# Patient Record
Sex: Male | Born: 1970 | Race: White | Hispanic: No | Marital: Married | Smoking: Current every day smoker
Health system: Southern US, Community
[De-identification: ages and names within clinical notes are randomized; demographics above are authoritative.]

## PROBLEM LIST (undated history)

## (undated) DIAGNOSIS — I10 Essential (primary) hypertension: Secondary | ICD-10-CM

## (undated) DIAGNOSIS — E291 Testicular hypofunction: Secondary | ICD-10-CM

## (undated) DIAGNOSIS — F909 Attention-deficit hyperactivity disorder, unspecified type: Secondary | ICD-10-CM

## (undated) DIAGNOSIS — G5602 Carpal tunnel syndrome, left upper limb: Secondary | ICD-10-CM

## (undated) DIAGNOSIS — N529 Male erectile dysfunction, unspecified: Secondary | ICD-10-CM

## (undated) DIAGNOSIS — F988 Other specified behavioral and emotional disorders with onset usually occurring in childhood and adolescence: Secondary | ICD-10-CM

## (undated) DIAGNOSIS — F419 Anxiety disorder, unspecified: Secondary | ICD-10-CM

## (undated) DIAGNOSIS — G43909 Migraine, unspecified, not intractable, without status migrainosus: Secondary | ICD-10-CM

## (undated) DIAGNOSIS — E559 Vitamin D deficiency, unspecified: Secondary | ICD-10-CM

## (undated) DIAGNOSIS — G4733 Obstructive sleep apnea (adult) (pediatric): Secondary | ICD-10-CM

## (undated) DIAGNOSIS — G4730 Sleep apnea, unspecified: Secondary | ICD-10-CM

## (undated) DIAGNOSIS — J449 Chronic obstructive pulmonary disease, unspecified: Secondary | ICD-10-CM

## (undated) DIAGNOSIS — E785 Hyperlipidemia, unspecified: Secondary | ICD-10-CM

## (undated) DIAGNOSIS — Z720 Tobacco use: Secondary | ICD-10-CM

## (undated) DIAGNOSIS — Z79899 Other long term (current) drug therapy: Secondary | ICD-10-CM

## (undated) DIAGNOSIS — E669 Obesity, unspecified: Secondary | ICD-10-CM

## (undated) DIAGNOSIS — E063 Autoimmune thyroiditis: Secondary | ICD-10-CM

## (undated) HISTORY — DX: Other specified behavioral and emotional disorders with onset usually occurring in childhood and adolescence: F98.8

## (undated) HISTORY — DX: Testicular hypofunction: E29.1

## (undated) HISTORY — DX: Tobacco use: Z72.0

## (undated) HISTORY — DX: Anxiety disorder, unspecified: F41.9

## (undated) HISTORY — DX: Attention-deficit hyperactivity disorder, unspecified type: F90.9

## (undated) HISTORY — DX: Other long term (current) drug therapy: Z79.899

## (undated) HISTORY — PX: HEMORRHOID SURGERY: SHX153

## (undated) HISTORY — DX: Carpal tunnel syndrome, left upper limb: G56.02

## (undated) HISTORY — DX: Obesity, unspecified: E66.9

## (undated) HISTORY — DX: Male erectile dysfunction, unspecified: N52.9

## (undated) HISTORY — DX: Vitamin D deficiency, unspecified: E55.9

## (undated) HISTORY — DX: Hyperlipidemia, unspecified: E78.5

## (undated) HISTORY — DX: Migraine, unspecified, not intractable, without status migrainosus: G43.909

## (undated) HISTORY — DX: Sleep apnea, unspecified: G47.30

## (undated) HISTORY — DX: Obstructive sleep apnea (adult) (pediatric): G47.33

## (undated) HISTORY — DX: Essential (primary) hypertension: I10

---

## 2004-12-18 ENCOUNTER — Emergency Department: Payer: Self-pay | Admitting: Emergency Medicine

## 2005-05-06 ENCOUNTER — Emergency Department: Payer: Self-pay | Admitting: Unknown Physician Specialty

## 2005-05-15 ENCOUNTER — Emergency Department: Payer: Self-pay | Admitting: General Practice

## 2011-05-14 ENCOUNTER — Emergency Department (HOSPITAL_COMMUNITY)
Admission: EM | Admit: 2011-05-14 | Discharge: 2011-05-14 | Disposition: A | Discharge status: Home Health | Payer: BC Managed Care – PPO | Attending: Emergency Medicine | Admitting: Emergency Medicine

## 2011-05-14 ENCOUNTER — Encounter (HOSPITAL_COMMUNITY): Payer: Self-pay | Admitting: Emergency Medicine

## 2011-05-14 ENCOUNTER — Emergency Department (HOSPITAL_COMMUNITY): Payer: BC Managed Care – PPO

## 2011-05-14 ENCOUNTER — Other Ambulatory Visit: Payer: Self-pay

## 2011-05-14 DIAGNOSIS — F17200 Nicotine dependence, unspecified, uncomplicated: Secondary | ICD-10-CM | POA: Insufficient documentation

## 2011-05-14 DIAGNOSIS — J4489 Other specified chronic obstructive pulmonary disease: Secondary | ICD-10-CM | POA: Insufficient documentation

## 2011-05-14 DIAGNOSIS — J449 Chronic obstructive pulmonary disease, unspecified: Secondary | ICD-10-CM | POA: Insufficient documentation

## 2011-05-14 DIAGNOSIS — R079 Chest pain, unspecified: Secondary | ICD-10-CM | POA: Insufficient documentation

## 2011-05-14 DIAGNOSIS — G43909 Migraine, unspecified, not intractable, without status migrainosus: Secondary | ICD-10-CM | POA: Insufficient documentation

## 2011-05-14 DIAGNOSIS — I10 Essential (primary) hypertension: Secondary | ICD-10-CM | POA: Insufficient documentation

## 2011-05-14 HISTORY — DX: Chronic obstructive pulmonary disease, unspecified: J44.9

## 2011-05-14 HISTORY — DX: Essential (primary) hypertension: I10

## 2011-05-14 LAB — DIFFERENTIAL
Basophils Absolute: 0.1 10*3/uL (ref 0.0–0.1)
Eosinophils Relative: 3 % (ref 0–5)
Lymphocytes Relative: 38 % (ref 12–46)
Neutro Abs: 5.6 10*3/uL (ref 1.7–7.7)

## 2011-05-14 LAB — BASIC METABOLIC PANEL
Calcium: 9.6 mg/dL (ref 8.4–10.5)
Creatinine, Ser: 0.79 mg/dL (ref 0.50–1.35)
GFR calc Af Amer: >90 mL/min (ref >90)
GFR calc non Af Amer: >90 mL/min (ref >90)

## 2011-05-14 LAB — CBC
Platelets: 250 10*3/uL (ref 150–400)
RDW: 13.3 % (ref 11.5–15.5)
WBC: 11.1 10*3/uL — ABNORMAL HIGH (ref 4.0–10.5)

## 2011-05-14 LAB — POCT I-STAT TROPONIN I
Troponin i, poc: 0 ng/mL (ref 0.00–0.08)
Troponin i, poc: 0 ng/mL (ref 0.00–0.08)

## 2011-05-14 MED ORDER — DIPHENHYDRAMINE HCL 50 MG/ML IJ SOLN
INTRAMUSCULAR | Status: AC
  Filled 2011-05-14: qty 1

## 2011-05-14 MED ORDER — POTASSIUM CHLORIDE CRYS ER 20 MEQ PO TBCR
40.0000 meq | EXTENDED_RELEASE_TABLET | Freq: Once | ORAL | Status: AC
  Administered 2011-05-14: 40 meq via ORAL
  Filled 2011-05-14: qty 2

## 2011-05-14 MED ORDER — DIPHENHYDRAMINE HCL 50 MG/ML IJ SOLN
25.0000 mg | Freq: Once | INTRAMUSCULAR | Status: AC
Start: 2011-05-14 — End: 2011-05-14
  Administered 2011-05-14: 25 mg via INTRAVENOUS

## 2011-05-14 MED ORDER — DIPHENHYDRAMINE HCL 25 MG PO CAPS: 25.0000 mg | ORAL_CAPSULE | Freq: Once | ORAL | Status: DC

## 2011-05-14 MED ORDER — ASPIRIN 81 MG PO CHEW
324.0000 mg | CHEWABLE_TABLET | Freq: Once | ORAL | Status: AC
  Administered 2011-05-14: 324 mg via ORAL
  Filled 2011-05-14: qty 4

## 2011-05-14 MED ORDER — METOCLOPRAMIDE HCL 5 MG/ML IJ SOLN
10.0000 mg | Freq: Once | INTRAMUSCULAR | Status: AC
  Administered 2011-05-14: 10 mg via INTRAMUSCULAR
  Filled 2011-05-14: qty 2

## 2011-05-14 NOTE — ED Provider Notes (Signed)
History     CSN: [id number]  Arrival date & time 05/14/11  0011   First MD Initiated Contact with Patient 05/14/11 0302      Chief Complaint  Patient presents with  . Chest Pain    (Consider location/radiation/quality/duration/timing/severity/associated sxs/prior treatment) Patient is a 41 y.o. male presenting with chest pain. The history is provided by the patient. No language interpreter was used.  Chest Pain The chest pain began 6 - 12 hours ago. Chest pain occurs constantly. The chest pain is unchanged. Associated with: nothing. At its most intense, the pain is at 9/10. The pain is currently at 1/10. The quality of the pain is described as dull. The pain does not radiate. Exacerbated by: nothing. Pertinent negatives for primary symptoms include no fatigue, no syncope, no shortness of breath, no cough, no wheezing, no palpitations, no abdominal pain and no dizziness.  Pertinent negatives for associated symptoms include no claudication, no lower extremity edema, no numbness and no weakness. He tried nothing for the symptoms. Risk factors include male gender and smoking/tobacco exposure.  Pertinent negatives for past medical history include no Marfan's syndrome and no seizures.  Pertinent negatives for family medical history include: no Marfan's syndrome in family.  Procedure history is negative for cardiac catheterization, echocardiogram and exercise treadmill test.   Also has worsening of his migraines with BP being elevated.  Pain is present for days but was not sudden onset and not the worst Gaylyn Rong of his life.  No neuro deficits.  No tractional components.  Pain is throbbing and 7/10 and improved markedly as pressure went down.    Past Medical History  Diagnosis Date  . Hypertension   . COPD (chronic obstructive pulmonary disease)     History reviewed. No pertinent past surgical history.  No family history on file.  History  Substance Use Topics  . Smoking status: Current  Everyday Smoker  . Smokeless tobacco: Not on file  . Alcohol Use: No      Review of Systems  Constitutional: Negative.  Negative for fatigue.  Eyes: Negative.   Respiratory: Negative for cough, shortness of breath and wheezing.   Cardiovascular: Positive for chest pain. Negative for palpitations, claudication and syncope.  Gastrointestinal: Negative for abdominal pain and abdominal distention.  Genitourinary: Negative.   Skin: Negative.   Neurological: Positive for headaches. Negative for dizziness, seizures, facial asymmetry, speech difficulty, weakness and numbness.  Hematological: Negative.   Psychiatric/Behavioral: Negative.     Allergies  Review of patient's allergies indicates no known allergies.  Home Medications   Current Outpatient Rx  Name Route Sig Dispense Refill  . BUTALBITAL-ACETAMINOPHEN 50-650 MG PO TABS Oral Take 1 tablet by mouth every 8 (eight) hours as needed. For headaches    . ALPRAZOLAM 1 MG PO TABS Oral Take 1 mg by mouth 3 (three) times daily as needed. anxiety    . LOSARTAN POTASSIUM-HCTZ 50-12.5 MG PO TABS Oral Take 1 tablet by mouth daily.      BP 123/76  Pulse 72  Temp(Src) 97.7 F (36.5 C) (Oral)  Resp 20  SpO2 97%  Physical Exam  Constitutional: He is oriented to person, place, and time. He appears well-developed and well-nourished. No distress.  HENT:  Head: Normocephalic and atraumatic.  Mouth/Throat: Oropharynx is clear and moist.  Eyes: Conjunctivae are normal. Pupils are equal, round, and reactive to light.  Neck: Normal range of motion. Neck supple. No JVD present.  Cardiovascular: Normal rate and regular rhythm.   Pulmonary/Chest: Effort  normal and breath sounds normal. He has no wheezes. He has no rales.  Abdominal: Soft. Bowel sounds are normal.  Musculoskeletal: Normal range of motion. He exhibits no edema.  Neurological: He is alert and oriented to person, place, and time.  Skin: Skin is warm and dry.  Psychiatric: He has a  normal mood and affect.    ED Course  Procedures (including critical care time)  Labs Reviewed  BASIC METABOLIC PANEL - Abnormal; Notable for the following:    Potassium 3.0 (*)    Glucose, Bld 105 (*)    All other components within normal limits  CBC - Abnormal; Notable for the following:    WBC 11.1 (*)    MCHC 36.3 (*)    All other components within normal limits  DIFFERENTIAL - Abnormal; Notable for the following:    Lymphs Abs 4.2 (*)    All other components within normal limits  POCT I-STAT TROPONIN I  POCT I-STAT TROPONIN I   Dg Chest 2 View  05/14/2011  *RADIOLOGY REPORT*  Clinical Data: Chest pain, hypertension, shortness of breath.  CHEST - 2 VIEW  Comparison: None.  Findings: Mild bronchitic change. Subpleural bullous change.  No focal consolidation.  No pleural effusion or pneumothorax. Cardiomediastinal contours within normal limits.  No acute osseous abnormality.  IMPRESSION: Mild bronchitic change without focal consolidation.  Original Report Authenticated By: Waneta Martins, M.D.     No diagnosis found.   Date: 05/14/2011  Rate: 72  Rhythm: normal sinus rhythm  QRS Axis: normal  Intervals: normal  ST/T Wave abnormalities: normal  Conduction Disutrbances:none  Narrative Interpretation:   Old EKG Reviewed: none available   MDM  EDP verbalized to the patient intent to place patient in observation with stress test in am but patient adamantly refuses.  Approached patient multiple times to elicit his cooperation but each time refused to stay.  EDP explained to patient with the pts wife present that the risk of signing out AMA was but not limited to: death, heart attack, congestive heart failure, coma, prolonged morbidity.  Patient is AO3 and has decision making capacity to refuse.  He is welcomed to return at any time and should follow up immediately with his family doctor.  Patient and wife verbalize understanding        Oprah Camarena K Alecxander Mainwaring-Rasch,  MD 05/14/11 [phone]

## 2011-05-14 NOTE — Discharge Instructions (Signed)
Arterial Hypertension Arterial hypertension (high blood pressure) is a condition of elevated pressure in your blood vessels. Hypertension over a long period of time is a risk factor for strokes, heart attacks, and heart failure. It is also the leading cause of kidney (renal) failure.  CAUSES   In Adults -- Over 90% of all hypertension has no known cause. This is called essential or primary hypertension. In the other 10% of people with hypertension, the increase in blood pressure is caused by another disorder. This is called secondary hypertension. Important causes of secondary hypertension are:   Heavy alcohol use.   Obstructive sleep apnea.   Hyperaldosterosim (Conn's syndrome).   Steroid use.   Chronic kidney failure.   Hyperparathyroidism.   Medications.   Renal artery stenosis.   Pheochromocytoma.   Cushing's disease.   Coarctation of the aorta.   Scleroderma renal crisis.   Licorice (in excessive amounts).   Drugs (cocaine, methamphetamine).  Your caregiver can explain any items above that apply to you.  In Children -- Secondary hypertension is more common and should always be considered.   Pregnancy -- Few women of childbearing age have high blood pressure. However, up to 10% of them develop hypertension of pregnancy. Generally, this will not harm the woman. It Even be a sign of 3 complications of pregnancy: preeclampsia, HELLP syndrome, and eclampsia. Follow up and control with medication is necessary.  SYMPTOMS   This condition normally does not produce any noticeable symptoms. It is usually found during a routine exam.   Malignant hypertension is a late problem of high blood pressure. It Monger have the following symptoms:   Headaches.   Blurred vision.   End-organ damage (this means your kidneys, heart, lungs, and other organs are being damaged).   Stressful situations can increase the blood pressure. If a person with normal blood pressure has their blood  pressure go up while being seen by their caregiver, this is often termed "white coat hypertension." Its importance is not known. It Alkire be related with eventually developing hypertension or complications of hypertension.   Hypertension is often confused with mental tension, stress, and anxiety.  DIAGNOSIS  The diagnosis is made by 3 separate blood pressure measurements. They are taken at least 1 week apart from each other. If there is organ damage from hypertension, the diagnosis Lemire be made without repeat measurements. Hypertension is usually identified by having blood pressure readings:  Above 140/90 mmHg measured in both arms, at 3 separate times, over a couple weeks.   Over 130/80 mmHg should be considered a risk factor and Grisanti require treatment in patients with diabetes.  Blood pressure readings over 120/80 mmHg are called "pre-hypertension" even in non-diabetic patients. To get a true blood pressure measurement, use the following guidelines. Be aware of the factors that can alter blood pressure readings.  Take measurements at least 1 hour after caffeine.   Take measurements 30 minutes after smoking and without any stress. This is another reason to quit smoking - it raises your blood pressure.   Use a proper cuff size. Ask your caregiver if you are not sure about your cuff size.   Most home blood pressure cuffs are automatic. They will measure systolic and diastolic pressures. The systolic pressure is the pressure reading at the start of sounds. Diastolic pressure is the pressure at which the sounds disappear. If you are elderly, measure pressures in multiple postures. Try sitting, lying or standing.   Sit at rest for a minimum of   5 minutes before taking measurements.   You should not be on any medications like decongestants. These are found in many cold medications.   Record your blood pressure readings and review them with your caregiver.  If you have hypertension:  Your caregiver  may do tests to be sure you do not have secondary hypertension (see "causes" above).   Your caregiver may also look for signs of metabolic syndrome. This is also called Syndrome X or Insulin Resistance Syndrome. You may have this syndrome if you have type 2 diabetes, abdominal obesity, and abnormal blood lipids in addition to hypertension.   Your caregiver will take your medical and family history and perform a physical exam.   Diagnostic tests may include blood tests (for glucose, cholesterol, potassium, and kidney function), a urinalysis, or an EKG. Other tests may also be necessary depending on your condition.  PREVENTION  There are important lifestyle issues that you can adopt to reduce your chance of developing hypertension:  Maintain a normal weight.   Limit the amount of salt (sodium) in your diet.   Exercise often.   Limit alcohol intake.   Get enough potassium in your diet. Discuss specific advice with your caregiver.   Follow a DASH diet (dietary approaches to stop hypertension). This diet is rich in fruits, vegetables, and low-fat dairy products, and avoids certain fats.  PROGNOSIS  Essential hypertension cannot be cured. Lifestyle changes and medical treatment can lower blood pressure and reduce complications. The prognosis of secondary hypertension depends on the underlying cause. Many people whose hypertension is controlled with medicine or lifestyle changes can live a normal, healthy life.  RISKS AND COMPLICATIONS  While high blood pressure alone is not an illness, it often requires treatment due to its short- and long-term effects on many organs. Hypertension increases your risk for:  CVAs or strokes (cerebrovascular accident).   Heart failure due to chronically high blood pressure (hypertensive cardiomyopathy).   Heart attack (myocardial infarction).   Damage to the retina (hypertensive retinopathy).   Kidney failure (hypertensive nephropathy).  Your caregiver can  explain list items above that apply to you. Treatment of hypertension can significantly reduce the risk of complications. TREATMENT   For overweight patients, weight loss and regular exercise are recommended. Physical fitness lowers blood pressure.   Mild hypertension is usually treated with diet and exercise. A diet rich in fruits and vegetables, fat-free dairy products, and foods low in fat and salt (sodium) can help lower blood pressure. Decreasing salt intake decreases blood pressure in a 1/3 of people.   Stop smoking if you are a smoker.  The steps above are highly effective in reducing blood pressure. While these actions are easy to suggest, they are difficult to achieve. Most patients with moderate or severe hypertension end up requiring medications to bring their blood pressure down to a normal level. There are several classes of medications for treatment. Blood pressure pills (antihypertensives) will lower blood pressure by their different actions. Lowering the blood pressure by 10 mmHg may decrease the risk of complications by as much as 25%. The goal of treatment is effective blood pressure control. This will reduce your risk for complications. Your caregiver will help you determine the best treatment for you according to your lifestyle. What is excellent treatment for one person, may not be for you. HOME CARE INSTRUCTIONS   Do not smoke.   Follow the lifestyle changes outlined in the "Prevention" section.   If you are on medications, follow the directions   carefully. Blood pressure medications must be taken as prescribed. Skipping doses reduces their benefit. It also puts you at risk for problems.   Follow up with your caregiver, as directed.   If you are asked to monitor your blood pressure at home, follow the guidelines in the "Diagnosis" section above.  SEEK MEDICAL CARE IF:   You think you are having medication side effects.   You have recurrent headaches or lightheadedness.     You have swelling in your ankles.   You have trouble with your vision.  SEEK IMMEDIATE MEDICAL CARE IF:   You have sudden onset of chest pain or pressure, difficulty breathing, or other symptoms of a heart attack.   You have a severe headache.   You have symptoms of a stroke (such as sudden weakness, difficulty speaking, difficulty walking).  MAKE SURE YOU:   Understand these instructions.   Will watch your condition.   Will get help right away if you are not doing well or get worse.  Document Released: 02/16/2005 Document Revised: 02/05/2011 Document Reviewed: 09/16/2006 [facility] Patient Information 2012 [place], [place].Arterial Hypertension Arterial hypertension (high blood pressure) is a condition of elevated pressure in your blood vessels. Hypertension over a long period of time is a risk factor for strokes, heart attacks, and heart failure. It is also the leading cause of kidney (renal) failure.  CAUSES   In Adults -- Over 90% of all hypertension has no known cause. This is called essential or primary hypertension. In the other 10% of people with hypertension, the increase in blood pressure is caused by another disorder. This is called secondary hypertension. Important causes of secondary hypertension are:   Heavy alcohol use.   Obstructive sleep apnea.   Hyperaldosterosim (Conn's syndrome).   Steroid use.   Chronic kidney failure.   Hyperparathyroidism.   Medications.   Renal artery stenosis.   Pheochromocytoma.   Cushing's disease.   Coarctation of the aorta.   Scleroderma renal crisis.   Licorice (in excessive amounts).   Drugs (cocaine, methamphetamine).  Your caregiver can explain any items above that apply to you.  In Children -- Secondary hypertension is more common and should always be considered.   Pregnancy -- Few women of childbearing age have high blood pressure. However, up to 10% of them develop hypertension of pregnancy. Generally, this  will not harm the woman. It may be a sign of 3 complications of pregnancy: preeclampsia, HELLP syndrome, and eclampsia. Follow up and control with medication is necessary.  SYMPTOMS   This condition normally does not produce any noticeable symptoms. It is usually found during a routine exam.   Malignant hypertension is a late problem of high blood pressure. It may have the following symptoms:   Headaches.   Blurred vision.   End-organ damage (this means your kidneys, heart, lungs, and other organs are being damaged).   Stressful situations can increase the blood pressure. If a person with normal blood pressure has their blood pressure go up while being seen by their caregiver, this is often termed "white coat hypertension." Its importance is not known. It may be related with eventually developing hypertension or complications of hypertension.   Hypertension is often confused with mental tension, stress, and anxiety.  DIAGNOSIS  The diagnosis is made by 3 separate blood pressure measurements. They are taken at least 1 week apart from each other. If there is organ damage from hypertension, the diagnosis may be made without repeat measurements. Hypertension is usually identified by having  blood pressure readings:  Above 140/90 mmHg measured in both arms, at 3 separate times, over a couple weeks.   Over 130/80 mmHg should be considered a risk factor and may require treatment in patients with diabetes.  Blood pressure readings over 120/80 mmHg are called "pre-hypertension" even in non-diabetic patients. To get a true blood pressure measurement, use the following guidelines. Be aware of the factors that can alter blood pressure readings.  Take measurements at least 1 hour after caffeine.   Take measurements 30 minutes after smoking and without any stress. This is another reason to quit smoking - it raises your blood pressure.   Use a proper cuff size. Ask your caregiver if you are not sure  about your cuff size.   Most home blood pressure cuffs are automatic. They will measure systolic and diastolic pressures. The systolic pressure is the pressure reading at the start of sounds. Diastolic pressure is the pressure at which the sounds disappear. If you are elderly, measure pressures in multiple postures. Try sitting, lying or standing.   Sit at rest for a minimum of 5 minutes before taking measurements.   You should not be on any medications like decongestants. These are found in many cold medications.   Record your blood pressure readings and review them with your caregiver.  If you have hypertension:  Your caregiver may do tests to be sure you do not have secondary hypertension (see "causes" above).   Your caregiver may also look for signs of metabolic syndrome. This is also called Syndrome X or Insulin Resistance Syndrome. You may have this syndrome if you have type 2 diabetes, abdominal obesity, and abnormal blood lipids in addition to hypertension.   Your caregiver will take your medical and family history and perform a physical exam.   Diagnostic tests may include blood tests (for glucose, cholesterol, potassium, and kidney function), a urinalysis, or an EKG. Other tests may also be necessary depending on your condition.  PREVENTION  There are important lifestyle issues that you can adopt to reduce your chance of developing hypertension:  Maintain a normal weight.   Limit the amount of salt (sodium) in your diet.   Exercise often.   Limit alcohol intake.   Get enough potassium in your diet. Discuss specific advice with your caregiver.   Follow a DASH diet (dietary approaches to stop hypertension). This diet is rich in fruits, vegetables, and low-fat dairy products, and avoids certain fats.  PROGNOSIS  Essential hypertension cannot be cured. Lifestyle changes and medical treatment can lower blood pressure and reduce complications. The prognosis of secondary  hypertension depends on the underlying cause. Many people whose hypertension is controlled with medicine or lifestyle changes can live a normal, healthy life.  RISKS AND COMPLICATIONS  While high blood pressure alone is not an illness, it often requires treatment due to its short- and long-term effects on many organs. Hypertension increases your risk for:  CVAs or strokes (cerebrovascular accident).   Heart failure due to chronically high blood pressure (hypertensive cardiomyopathy).   Heart attack (myocardial infarction).   Damage to the retina (hypertensive retinopathy).   Kidney failure (hypertensive nephropathy).  Your caregiver can explain list items above that apply to you. Treatment of hypertension can significantly reduce the risk of complications. TREATMENT   For overweight patients, weight loss and regular exercise are recommended. Physical fitness lowers blood pressure.   Mild hypertension is usually treated with diet and exercise. A diet rich in fruits and vegetables, fat-free dairy  products, and foods low in fat and salt (sodium) can help lower blood pressure. Decreasing salt intake decreases blood pressure in a 1/3 of people.   Stop smoking if you are a smoker.  The steps above are highly effective in reducing blood pressure. While these actions are easy to suggest, they are difficult to achieve. Most patients with moderate or severe hypertension end up requiring medications to bring their blood pressure down to a normal level. There are several classes of medications for treatment. Blood pressure pills (antihypertensives) will lower blood pressure by their different actions. Lowering the blood pressure by 10 mmHg may decrease the risk of complications by as much as 25%. The goal of treatment is effective blood pressure control. This will reduce your risk for complications. Your caregiver will help you determine the best treatment for you according to your lifestyle. What is  excellent treatment for one person, may not be for you. HOME CARE INSTRUCTIONS   Do not smoke.   Follow the lifestyle changes outlined in the "Prevention" section.   If you are on medications, follow the directions carefully. Blood pressure medications must be taken as prescribed. Skipping doses reduces their benefit. It also puts you at risk for problems.   Follow up with your caregiver, as directed.   If you are asked to monitor your blood pressure at home, follow the guidelines in the "Diagnosis" section above.  SEEK MEDICAL CARE IF:   You think you are having medication side effects.   You have recurrent headaches or lightheadedness.   You have swelling in your ankles.   You have trouble with your vision.  SEEK IMMEDIATE MEDICAL CARE IF:   You have sudden onset of chest pain or pressure, difficulty breathing, or other symptoms of a heart attack.   You have a severe headache.   You have symptoms of a stroke (such as sudden weakness, difficulty speaking, difficulty walking).  MAKE SURE YOU:   Understand these instructions.   Will watch your condition.   Will get help right away if you are not doing well or get worse.  Document Released: 02/16/2005 Document Revised: 02/05/2011 Document Reviewed: 09/16/2006 [facility] Patient Information 2012 [place], [place].

## 2011-05-14 NOTE — ED Notes (Signed)
First contact with pt. Pt placed on monitor. Iv start wife at bedside

## 2011-05-14 NOTE — ED Notes (Signed)
PT. REPORTS SUBSTERNAL CHEST PAIN WITH SOB , COUGH , NAUSEA AND DIZZINESS , ALSO REPORTS ELEVATED BLOOD PRESSURE FOR 3 DAYS , SEEN AT A WALK-IN CLINIC TODAY ADVISED TO INCREASE HIS BLOOD PRESSURE MEDICATION ( LOSARTAN ).

## 2014-10-01 NOTE — Progress Notes (Signed)
This encounter was created in error - please disregard.

## 2014-10-04 ENCOUNTER — Encounter: Payer: Self-pay | Admitting: Internal Medicine

## 2014-10-10 ENCOUNTER — Encounter: Payer: Self-pay | Admitting: Internal Medicine

## 2014-12-04 ENCOUNTER — Telehealth: Payer: Self-pay | Admitting: Physician Assistant

## 2014-12-04 NOTE — Telephone Encounter (Signed)
Patty from [facility] is calling about patient that is at their office with abnormal EKG, dizziness, and HTN. Since patient is unable to be seen until Friday, their office wants to know if they need to start patient on a beta blocker prior to his appointment. Requested them to send a fax of EKG.

## 2014-12-04 NOTE — Telephone Encounter (Signed)
Received ekg via fax.  Dr. Elease Hashimoto reviewed ekg and spoke with Cletis Athens, NP.  He advised ekg showed a great deal of artifact but appeared to show NSR.  He advised she increase HCTZ to 25 mg and patient come in for new patient appointment which is scheduled for Friday 10/7.  EKG placed in file for scanning into epic.

## 2014-12-04 NOTE — Telephone Encounter (Signed)
New message     Pt has a new pt appt scheduled on Friday.  [facility] want to know if we want them to start pt on a beta blocker prior to his appt on Friday?

## 2014-12-05 ENCOUNTER — Encounter: Payer: Self-pay | Admitting: Physician Assistant

## 2014-12-05 ENCOUNTER — Other Ambulatory Visit: Payer: Self-pay

## 2014-12-06 NOTE — Progress Notes (Signed)
Cardiology Office Note    Date:  12/07/2014   ID:  Benjamin Small, DOB 07/10/1970, MRN [id number]  PCP:  Dema Severin, NP  Cardiologist:  New patient     History of Present Illness: Benjamin Small is a 44 y.o. male with a hx of ADHD on adderall, uncontrolled HTN, anxiety/PTSD, obesity, tobacco abuse, OSA non complaint with CPAP, migraines and no prior cardiac history who presents to clinic for evaluation of CP and HTN.   No DM or HLD per patient. Apparently had a stress test ~ 2 years ago that was normal.  His paternal uncle had bypass in 30s. He smokes 1PPD for ~ 20 years.   Sometimes patient gets a pressure in his chest that gets worse with anxiety. He currently has 1/10 chest pressure while sitting in the office. He works in [employer] and is pretty active on the job. He does not formally exercise but walks a lot at work. He denies exertional CP but does get "winded" sometimes. CP does not radiate or associated with SOB, diaphoresis, nausea. Sometimes has palpitations. He has a watch that tracks his HR and it seems to always be elevated. Sometimes it is low 100s when sleeping. He doesn't know what it does with exertion. He sometimes has some lightheadedness with over- exertion.  No orthopnea, PND or LE edema.  He was seen at primary care yesterday and we were called for HTN and abnormal ECG. This was faxed to Dr. Elease Hashimoto who said it looked like sinus tach but there was a lot of artifact. He advised she increase HCTZ to 25 mg and patient come in for new patient appointment which is scheduled for Friday 10/7.  ECG today NSR with RAE HR 97. He currently takes Hyzarr-HCTZ 50-12.5 mg (increased to 100-25mg  yesterday) and amlodipine  and BP is 144/88. Repeat 154/98.    Studies:  none  Recent Labs/Images:  No results for input(s): NA, K, BUN, CREATININE, ALT, HGB, TSH, LDLCALC, LDLDIRECT, HDL, BNP, PROBNP in the last 8760 hours.  Invalid input(s): LDL   Dg  Chest 2 View  05/14/2011   *RADIOLOGY REPORT*  Clinical Data: Chest pain, hypertension, shortness of breath.  CHEST - 2 VIEW  Comparison: None.  Findings: Mild bronchitic change. Subpleural bullous change.  No focal consolidation.  No pleural effusion or pneumothorax. Cardiomediastinal contours within normal limits.  No acute osseous abnormality.  IMPRESSION: Mild bronchitic change without focal consolidation.  Original Report Authenticated By: Waneta Martins, M.D.    Wt Readings from Last 3 Encounters:  12/07/14 298 lb 12.8 oz (135.535 kg)     Past Medical History  Diagnosis Date  . Hypertension   . COPD (chronic obstructive pulmonary disease) (HCC)   . Obesity   . Adult ADHD   . ADD (attention deficit disorder)   . Migraines   . Sleep apnea   . Hyperlipidemia   . Testicular hypofunction   . Male erectile disorder   . Long term use of drug   . Carpal tunnel syndrome on left   . Anxiety disorder   . Vitamin D deficiency     Current Outpatient Prescriptions  Medication Sig Dispense Refill  . ACETAMINOPHEN-BUTALBITAL 50-650 MG TABS Take 1 tablet by mouth every 8 (eight) hours as needed. For headaches    . ADDERALL XR 20 MG 24 hr capsule TAKE 2 CAPSULE BY MOUTH  IN THE MORNING FOR ADHD  0  . ALPRAZolam (XANAX) 1 MG tablet Take 1 mg by mouth  3 (three) times daily as needed. anxiety    . amLODipine (NORVASC) 10 MG tablet Take 10 mg by mouth daily.    . Cholecalciferol 5000 UNITS TABS Take 5,000 Units by mouth once a week.    . fluticasone (FLONASE) 50 MCG/ACT nasal spray Place 1 spray into both nostrils daily as needed for allergies.     Marland Kitchen loratadine (CLARITIN) 10 MG tablet Take 10 mg by mouth daily as needed for allergies.     Marland Kitchen losartan-hydrochlorothiazide (HYZAAR) 100-25 MG tablet Take 1 tablet by mouth daily. 30 tablet 6  . Tadalafil (CIALIS) 2.5 MG TABS Take 2.5 mg by mouth daily as needed (Erectile Dysfunction).     . carvedilol (COREG) 3.125 MG tablet Take 1 tablet (3.125  mg total) by mouth 2 (two) times daily with a meal. 60 tablet 6  . varenicline (CHANTIX STARTING MONTH PAK) 0.5 MG X 11 & 1 MG X 42 tablet Take one 0.5 mg tablet by mouth once daily for 3 days, then increase to one 0.5 mg tablet twice daily for 4 days, then increase to one 1 mg tablet twice daily. 53 tablet 0  . varenicline (CHANTIX) 0.5 MG tablet Take 1 tablet (0.5 mg total) by mouth 2 (two) times daily. 60 tablet 0   No current facility-administered medications for this visit.     Allergies:   Review of patient's allergies indicates no known allergies.   Social History:  The patient  reports that he has been smoking.  He does not have any smokeless tobacco history on file. He reports that he does not drink alcohol or use illicit drugs.   Family History:  The patient's family history includes Cancer in his father; Other in his mother.   ROS:  Please see the history of present illness. All other systems reviewed and negative.    PHYSICAL EXAM: VS:  BP 154/98 mmHg  Pulse 97  Ht  (1.753 m)  Wt 298 lb 12.8 oz (135.535 kg)  BMI 44.10 kg/m2 Well nourished, well developed, in no acute distress HEENT: normal Neck: no JVD Cardiac:  normal S1, S2; RRR; no murmur Lungs:  clear to auscultation bilaterally, no wheezing, rhonchi or rales Abd: soft, nontender, no hepatomegaly Ext: no edema Skin: warm and dry Neuro:  CNs 2-12 intact, no focal abnormalities noted  EKG:  ECG RAE HR 97     ASSESSMENT AND PLAN:  Benjamin Small is a 43 y.o. male with a hx of ADHD on adderall, uncontrolled HTN, anxiety/PTSD, obesity, tobacco abuse, OSA non complaint with CPAP, migraines and no prior cardiac history who presents to clinic for evaluation of CP and HTN.    Chest pain- atypical but he has many RFs including family hx of early CAD, uncontrolled HTN, obesity and long term smoking. We will obtain ETT nuclear stress test  HTN- likely exacerbated by uncontrolled OSA and adderall use. Will  continue amlodipine  and losartan -HCTZ  ( started by PCP) will add Coreg 3.125mg  BID today as well. Patient will continued to monitor his BPs at home.  Resting tachycardia/subjective palpitations- Will add coreg 3.125mg  BID for resting tachycardia, palpitations and further BP control.   Tobacco abuse- interested in chantix. Will write Rx  Severe OSA- non complaint with CPAP. Will have him set up with our sleep study clinic to get mask refit.   Disposition:  FU with a cardiologist in 2-3 weeks to establish care. I have recommended Dr. Horris Latino.    (seen by DOD DR  Camnitz today as well- he agrees with plan)  Signed, Eustace Pen, PA-C, MHS 12/07/2014 10:48 AM    [facility] [address] [place], [place], [place]  [address] Phone: [phone]; Fax: [phone]

## 2014-12-07 ENCOUNTER — Other Ambulatory Visit: Payer: Self-pay | Admitting: Physician Assistant

## 2014-12-07 ENCOUNTER — Encounter: Payer: Self-pay | Admitting: Physician Assistant

## 2014-12-07 ENCOUNTER — Ambulatory Visit (INDEPENDENT_AMBULATORY_CARE_PROVIDER_SITE_OTHER): Payer: BLUE CROSS/BLUE SHIELD | Admitting: Physician Assistant

## 2014-12-07 VITALS — BP 154/98 | HR 97 | Ht 69.0 in | Wt 298.8 lb

## 2014-12-07 DIAGNOSIS — I10 Essential (primary) hypertension: Secondary | ICD-10-CM | POA: Diagnosis not present

## 2014-12-07 DIAGNOSIS — R079 Chest pain, unspecified: Secondary | ICD-10-CM

## 2014-12-07 DIAGNOSIS — G4733 Obstructive sleep apnea (adult) (pediatric): Secondary | ICD-10-CM

## 2014-12-07 MED ORDER — VARENICLINE TARTRATE 0.5 MG PO TABS: 0.5000 mg | ORAL_TABLET | Freq: Two times a day (BID) | ORAL | Status: DC

## 2014-12-07 MED ORDER — CARVEDILOL 3.125 MG PO TABS: 3.1250 mg | ORAL_TABLET | Freq: Two times a day (BID) | ORAL | Status: DC

## 2014-12-07 MED ORDER — LOSARTAN POTASSIUM-HCTZ 100-25 MG PO TABS: 1.0000 | ORAL_TABLET | Freq: Every day | ORAL | Status: DC

## 2014-12-07 MED ORDER — VARENICLINE TARTRATE 0.5 MG X 11 & 1 MG X 42 PO MISC: ORAL | Status: DC

## 2014-12-07 NOTE — Patient Instructions (Addendum)
Medication Instructions:   START TAKING  CARVEDILOL  3.125 MG TWICE A DAY  START STARTER MONTH PACK -CHANTIX   AFTER COMPLETION OF MONTH PACK START TAKING CHANTIX 0.5 MG TWICE  A DAY   Labwork:  NONE ORDER TODAY   Testing/Procedures:  Your physician has requested that you have en exercise stress myoview. For further information please visit [email or web address] Please follow instruction sheet, as given.   Your physician has recommended that you have a sleep study. This test records several body functions during sleep, including: brain activity, eye movement, oxygen and carbon dioxide blood levels, heart rate and rhythm, breathing rate and rhythm, the flow of air through your mouth and nose, snoring, body muscle movements, and chest and belly movement.  Follow-Up:  IN 2 TO 3 WEEKS WITH TIFFANY [place] AT [facility] AS NEW PATIENT OR THE NEXT AVAILABLE APPOINTMENT WITH A PROVIDER TO BE ESTABLISHED AS NEW PT   Any Other Special Instructions Will Be Listed Below (If Applicable).  RECOMMEND TO CONTINUE CHANTIX PRESCRIPTION CONSULT PRIMARY CARE DOCTER REGINA YORK

## 2014-12-10 ENCOUNTER — Telehealth: Payer: Self-pay | Admitting: Physician Assistant

## 2014-12-10 NOTE — Telephone Encounter (Signed)
New message      Calling to see if it is ok to presc cialis ?  Their office has closed for the day.  Please call tomorrow with the answer.

## 2014-12-10 NOTE — Telephone Encounter (Signed)
Will forward to Cline Crock, PA-C for review and advisement.

## 2014-12-12 ENCOUNTER — Telehealth (HOSPITAL_COMMUNITY): Payer: Self-pay | Admitting: *Deleted

## 2014-12-12 NOTE — Telephone Encounter (Signed)
Patient given detailed instructions per Myocardial Perfusion Study Information Sheet for test on 12/17/14 at 1230. Patient notified to arrive 15 minutes early and that it is imperative to arrive on time for appointment to keep from having the test rescheduled.  If you need to cancel or reschedule your appointment, please call the office within 24 hours of your appointment. Failure to do so may result in a cancellation of your appointment, and a $50 no show fee. Patient verbalized understanding. Antionette CharMary J Amaree Leeper, RN

## 2014-12-17 ENCOUNTER — Ambulatory Visit (HOSPITAL_COMMUNITY): Payer: BLUE CROSS/BLUE SHIELD | Attending: Cardiovascular Disease

## 2014-12-17 DIAGNOSIS — I10 Essential (primary) hypertension: Secondary | ICD-10-CM | POA: Diagnosis not present

## 2014-12-17 DIAGNOSIS — I517 Cardiomegaly: Secondary | ICD-10-CM | POA: Insufficient documentation

## 2014-12-17 DIAGNOSIS — R0789 Other chest pain: Secondary | ICD-10-CM | POA: Diagnosis not present

## 2014-12-17 DIAGNOSIS — R079 Chest pain, unspecified: Secondary | ICD-10-CM | POA: Diagnosis present

## 2014-12-17 DIAGNOSIS — R0609 Other forms of dyspnea: Secondary | ICD-10-CM | POA: Diagnosis not present

## 2014-12-17 DIAGNOSIS — R002 Palpitations: Secondary | ICD-10-CM | POA: Insufficient documentation

## 2014-12-17 DIAGNOSIS — F17200 Nicotine dependence, unspecified, uncomplicated: Secondary | ICD-10-CM | POA: Diagnosis not present

## 2014-12-17 DIAGNOSIS — R42 Dizziness and giddiness: Secondary | ICD-10-CM | POA: Diagnosis not present

## 2014-12-17 MED ORDER — TECHNETIUM TC 99M SESTAMIBI GENERIC - CARDIOLITE
32.5000 | Freq: Once | INTRAVENOUS | Status: AC | PRN
  Administered 2014-12-17: 33 via INTRAVENOUS

## 2014-12-18 ENCOUNTER — Encounter (HOSPITAL_COMMUNITY): Payer: BLUE CROSS/BLUE SHIELD | Attending: Internal Medicine

## 2014-12-18 DIAGNOSIS — R0789 Other chest pain: Secondary | ICD-10-CM | POA: Diagnosis not present

## 2014-12-18 DIAGNOSIS — R0989 Other specified symptoms and signs involving the circulatory and respiratory systems: Secondary | ICD-10-CM

## 2014-12-18 LAB — MYOCARDIAL PERFUSION IMAGING
CHL CUP NUCLEAR SDS: 0
CHL CUP NUCLEAR SSS: 3
LHR: 0.33
LV dias vol: 170 mL
LV sys vol: 78 mL
Peak HR: 97 {beats}/min
Rest HR: 80 {beats}/min
SRS: 3
TID: 0.95

## 2014-12-18 MED ORDER — REGADENOSON 0.4 MG/5ML IV SOLN
0.4000 mg | Freq: Once | INTRAVENOUS | Status: AC
  Administered 2014-12-18: 0.4 mg via INTRAVENOUS

## 2014-12-18 MED ORDER — TECHNETIUM TC 99M SESTAMIBI GENERIC - CARDIOLITE
32.9000 | Freq: Once | INTRAVENOUS | Status: AC | PRN
  Administered 2014-12-18: 32.9 via INTRAVENOUS

## 2014-12-24 NOTE — Progress Notes (Signed)
Cardiology Office Note   Date:  12/25/2014   ID:  Benjamin Small, DOB 03-24-1970, MRN [id number]  PCP:  Dema SeverinYORK,REGINA F, NP  Cardiologist:   Madilyn Hookandolph, Amillia Biffle P, MD   Chief Complaint  Patient presents with  . New Evaluation    follow up from stress test  . Hypertension      History of Present Illness: Benjamin Small is a 44 y.o. male with ADHD, hypertension, anxiety, PTST, OSA, obesity and tobacco abuse who presents for follow up on chest pain and hypertension.  Benjamin Small saw Cline CrockKathryn Thompson on 10/6 with a report of chest pain.  EKG at that appointment was unremarkable.  BP was poorly-controlled (154/98) so carvedilol was added to his regimen.  He was referred for treadmill Cardiolite which was negative for ischemia.  Benjamin Small notes that his chest pain has resolved.  He thinks it was mostly due to anxiety and stress at his job.  He reported a hostile work environment and was subsequently fired.  Since then his stress level has been much better and he has not had any further episodes.  He has been taking Chantix for 2 week and has noticed a decrease in his desire to smoke.  He is now smoking 3/4 pack down from 1.5-2 packs/day.  He has not been wearing his CPAP mask because the mask does not fit well.  He is awaiting a refitting of his mask. He reports occasional LE edema but denies orthopnea or PND.   Past Medical History  Diagnosis Date  . Hypertension   . COPD (chronic obstructive pulmonary disease) (HCC)   . Obesity   . Adult ADHD   . ADD (attention deficit disorder)   . Migraines   . Sleep apnea   . Hyperlipidemia   . Testicular hypofunction   . Male erectile disorder   . Long term use of drug   . Carpal tunnel syndrome on left   . Anxiety disorder   . Vitamin D deficiency   . Essential hypertension 12/25/2014  . OSA (obstructive sleep apnea) 12/25/2014  . Tobacco abuse 12/25/2014    Past Surgical History  Procedure Laterality Date  . Hemorrhoid surgery        Current Outpatient Prescriptions  Medication Sig Dispense Refill  . ACETAMINOPHEN-BUTALBITAL 50-650 MG TABS Take 1 tablet by mouth every 8 (eight) hours as needed. For headaches    . ADDERALL XR 20 MG 24 hr capsule TAKE 2 CAPSULE BY MOUTH  IN THE MORNING FOR ADHD  0  . ALPRAZolam (XANAX) 1 MG tablet Take 1 mg by mouth 3 (three) times daily as needed. anxiety    . amLODipine (NORVASC) 10 MG tablet Take 10 mg by mouth daily.    . carvedilol (COREG) 25 MG tablet Take 1 tablet (25 mg total) by mouth 2 (two) times daily with a meal. 60 tablet 11  . Cholecalciferol 5000 UNITS TABS Take 5,000 Units by mouth once a week.    . fluticasone (FLONASE) 50 MCG/ACT nasal spray Place 1 spray into both nostrils daily as needed for allergies.     Marland Kitchen. loratadine (CLARITIN) 10 MG tablet Take 10 mg by mouth daily as needed for allergies.     Marland Kitchen. losartan-hydrochlorothiazide (HYZAAR) 100-25 MG tablet Take 1 tablet by mouth daily. 30 tablet 6  . Tadalafil (CIALIS) 2.5 MG TABS Take 1 tablet (2.5 mg total) by mouth daily as needed (Erectile Dysfunction). 10 tablet 0  . varenicline (CHANTIX) 0.5 MG tablet Take 1  tablet (0.5 mg total) by mouth 2 (two) times daily. 60 tablet 0   No current facility-administered medications for this visit.    Allergies:   Review of patient's allergies indicates no known allergies.    Social History:  The patient  reports that he has been smoking.  He does not have any smokeless tobacco history on file. He reports that he does not drink alcohol or use illicit drugs.   Family History:  The patient's family history includes Alcohol abuse in his mother; Breast cancer in his paternal grandmother; Cancer in his father, maternal grandfather, and maternal grandmother.    ROS:  Please see the history of present illness.  Otherwise, review of systems are positive for none.   All other systems are reviewed and negative.    PHYSICAL EXAM: VS:  BP 174/110 mmHg  Pulse 79  Ht  (1.753 m)   Wt 137.032 kg (302 lb 1.6 oz)  BMI 44.59 kg/m2 , BMI Body mass index is 44.59 kg/(m^2). GENERAL:  Well appearing HEENT:  Pupils equal round and reactive, fundi not visualized, oral mucosa unremarkable NECK:  No jugular venous distention, waveform within normal limits, carotid upstroke brisk and symmetric, no bruits, no thyromegaly LYMPHATICS:  No cervical adenopathy LUNGS:  Clear to auscultation bilaterally HEART:  RRR.  PMI not displaced or sustained,S1 and S2 within normal limits, no S3, no S4, no clicks, no rubs, no murmurs ABD:  Flat, positive bowel sounds normal in frequency in pitch, no bruits, no rebound, no guarding, no midline pulsatile mass, no hepatomegaly, no splenomegaly EXT:  2 plus pulses throughout, no edema, no cyanosis no clubbing SKIN:  No rashes no nodules NEURO:  Cranial nerves II through XII grossly intact, motor grossly intact throughout PSYCH:  Cognitively intact, oriented to person place and time   EKG:  EKG is ordered today. The ekg ordered today demonstrates sinus rhythm at 79 bpm.  Consider inferior infarct.  12/18/14 Treadmill Cardiolite:  Nuclear stress EF: 54%.  There was no ST segment deviation noted during stress.  Defect 1: There is a small defect of mild severity.  The study is normal.  This is a low risk study.  The left ventricular ejection fraction is mildly decreased (45-54%).  There is a fixed anterior wall defect with normal wall motion therefore representing an artifact. LVEF is low normal and LV is mildly dilated. There is no ischemia. Overall this is a low risk study.   Recent Labs: No results found for requested labs within last 365 days.    Lipid Panel No results found for: CHOL, TRIG, HDL, CHOLHDL, VLDL, LDLCALC, LDLDIRECT    Wt Readings from Last 3 Encounters:  12/25/14 137.032 kg (302 lb 1.6 oz)  12/17/14 135.172 kg (298 lb)  12/07/14 135.535 kg (298 lb 12.8 oz)      ASSESSMENT AND PLAN:  # Chest pain:  Resolved.  Stress test was negative and this was likely due to stress rather than ischemia.  # HTN:  Blood pressure is poorly controlled. This is been an ongoing issue for him for many years. We will increase his carvedilol to 25 mg every 12 hours.  He will continue on amlodipine, hydrochlorothiazide, and losartan.  Benjamin Small will keep a log of his blood pressures over the next 2 weeks and will follow-up in 2 weeks for a blood pressure check. We'll also refer him for a renal artery duplex ultrasound to assess for renal artery stenosis.  # CV Disease Prevention: I  encouraged Benjamin Small to get 30-40 minutes of exercise most days of the weeks. We will also check his lipids, as these have not been checked recently.   # Smoking cessation: Benjamin Small was congratulated on cutting back his smoking. I encouraged him to continue with this. He will continue on Chantix as it seems to be working well for him.  #Erectile dysfunction: Benjamin Small is currently seeking a new primary care provider. He has had difficulty getting his Cialis refill to his current PCP. We will provide a one time refill and have instructed him that we will not be refilling this in the future. It should be prescribed by PCP or urology.  Current medicines are reviewed at length with the patient today.  The patient does not have concerns regarding medicines.  The following changes have been made:  Increase carvedilol to 25 mg.  Labs/ tests ordered today include:   Orders Placed This Encounter  Procedures  . Lipid panel  . EKG 12-Lead     Disposition:   FU with Kieli Golladay C. Duke Salvia, MD in 1 year.  2 week BP check.    Signed, Madilyn Hook, MD  12/25/2014 9:03 AM    [facility]

## 2014-12-25 ENCOUNTER — Ambulatory Visit (INDEPENDENT_AMBULATORY_CARE_PROVIDER_SITE_OTHER): Payer: BLUE CROSS/BLUE SHIELD | Admitting: Cardiovascular Disease

## 2014-12-25 ENCOUNTER — Encounter: Payer: Self-pay | Admitting: Cardiovascular Disease

## 2014-12-25 ENCOUNTER — Ambulatory Visit: Payer: BLUE CROSS/BLUE SHIELD | Admitting: Cardiovascular Disease

## 2014-12-25 VITALS — BP 174/110 | HR 79 | Ht 69.0 in | Wt 302.1 lb

## 2014-12-25 DIAGNOSIS — E669 Obesity, unspecified: Secondary | ICD-10-CM | POA: Insufficient documentation

## 2014-12-25 DIAGNOSIS — I10 Essential (primary) hypertension: Secondary | ICD-10-CM | POA: Diagnosis not present

## 2014-12-25 DIAGNOSIS — Z720 Tobacco use: Secondary | ICD-10-CM

## 2014-12-25 DIAGNOSIS — G4733 Obstructive sleep apnea (adult) (pediatric): Secondary | ICD-10-CM | POA: Insufficient documentation

## 2014-12-25 DIAGNOSIS — Z13220 Encounter for screening for lipoid disorders: Secondary | ICD-10-CM | POA: Diagnosis not present

## 2014-12-25 HISTORY — DX: Essential (primary) hypertension: I10

## 2014-12-25 HISTORY — DX: Tobacco use: Z72.0

## 2014-12-25 HISTORY — DX: Obstructive sleep apnea (adult) (pediatric): G47.33

## 2014-12-25 MED ORDER — CARVEDILOL 25 MG PO TABS: 25.0000 mg | ORAL_TABLET | Freq: Two times a day (BID) | ORAL | Status: DC

## 2014-12-25 MED ORDER — TADALAFIL 2.5 MG PO TABS: 2.5000 mg | ORAL_TABLET | Freq: Every day | ORAL | Status: AC | PRN

## 2014-12-25 NOTE — Patient Instructions (Signed)
Medication Instructions:  INCREASE Carvedilol to 25 mg - take 1 tablet (25 mg total) by mouth twice daily. A new prescription has been sent to your pharmacy electronically.  Labwork: Your physician recommends that you return for lab work at your earliest convenience - FASTING.  Testing/Procedures: Your physician has requested that you have a renal artery duplex. During this test, an ultrasound is used to evaluate blood flow to the kidneys. Allow one hour for this exam. Do not eat after midnight the day before and avoid carbonated beverages. Take your medications as you usually do.  Follow-Up: Your phyisican recommends that you schedule a blood pressure check with our pharmacist, Phillips HayKristin Alvstad, PharmD.  Dr Duke Salviaandolph recommends that you schedule a follow-up appointment in 1 year. You will receive a reminder letter in the mail two months in advance. If you don't receive a letter, please call our office to schedule the follow-up appointment.  If you need a refill on your cardiac medications before your next appointment, please call your pharmacy

## 2015-01-01 ENCOUNTER — Inpatient Hospital Stay (HOSPITAL_COMMUNITY): Admission: RE | Admit: 2015-01-01 | Payer: BLUE CROSS/BLUE SHIELD | Source: Ambulatory Visit

## 2015-01-07 ENCOUNTER — Encounter (HOSPITAL_COMMUNITY): Payer: BLUE CROSS/BLUE SHIELD

## 2015-01-10 ENCOUNTER — Ambulatory Visit: Payer: Medicaid Other | Admitting: Pharmacist Clinician (PhC)/ Clinical Pharmacy Specialist

## 2015-01-20 ENCOUNTER — Ambulatory Visit (HOSPITAL_BASED_OUTPATIENT_CLINIC_OR_DEPARTMENT_OTHER): Payer: Medicaid Other | Attending: Internal Medicine

## 2015-01-28 ENCOUNTER — Inpatient Hospital Stay (HOSPITAL_COMMUNITY): Admission: RE | Admit: 2015-01-28 | Payer: BLUE CROSS/BLUE SHIELD | Source: Ambulatory Visit

## 2016-05-10 ENCOUNTER — Encounter (HOSPITAL_BASED_OUTPATIENT_CLINIC_OR_DEPARTMENT_OTHER): Payer: Self-pay | Admitting: *Deleted

## 2016-05-10 ENCOUNTER — Emergency Department (HOSPITAL_BASED_OUTPATIENT_CLINIC_OR_DEPARTMENT_OTHER): Payer: Worker's Compensation

## 2016-05-10 ENCOUNTER — Emergency Department (HOSPITAL_BASED_OUTPATIENT_CLINIC_OR_DEPARTMENT_OTHER)
Admission: EM | Admit: 2016-05-10 | Discharge: 2016-05-10 | Disposition: A | Discharge status: Home / Self Care | Payer: Worker's Compensation | Attending: Emergency Medicine | Admitting: Emergency Medicine

## 2016-05-10 DIAGNOSIS — Z79899 Other long term (current) drug therapy: Secondary | ICD-10-CM | POA: Insufficient documentation

## 2016-05-10 DIAGNOSIS — Y990 Civilian activity done for income or pay: Secondary | ICD-10-CM | POA: Insufficient documentation

## 2016-05-10 DIAGNOSIS — J449 Chronic obstructive pulmonary disease, unspecified: Secondary | ICD-10-CM | POA: Insufficient documentation

## 2016-05-10 DIAGNOSIS — Z23 Encounter for immunization: Secondary | ICD-10-CM | POA: Insufficient documentation

## 2016-05-10 DIAGNOSIS — Y929 Unspecified place or not applicable: Secondary | ICD-10-CM | POA: Insufficient documentation

## 2016-05-10 DIAGNOSIS — S61214A Laceration without foreign body of right ring finger without damage to nail, initial encounter: Secondary | ICD-10-CM | POA: Diagnosis present

## 2016-05-10 DIAGNOSIS — Y9389 Activity, other specified: Secondary | ICD-10-CM | POA: Insufficient documentation

## 2016-05-10 DIAGNOSIS — F17200 Nicotine dependence, unspecified, uncomplicated: Secondary | ICD-10-CM | POA: Diagnosis not present

## 2016-05-10 DIAGNOSIS — F909 Attention-deficit hyperactivity disorder, unspecified type: Secondary | ICD-10-CM | POA: Diagnosis not present

## 2016-05-10 DIAGNOSIS — W268XXA Contact with other sharp object(s), not elsewhere classified, initial encounter: Secondary | ICD-10-CM | POA: Diagnosis not present

## 2016-05-10 DIAGNOSIS — I10 Essential (primary) hypertension: Secondary | ICD-10-CM | POA: Diagnosis not present

## 2016-05-10 MED ORDER — LIDOCAINE HCL (PF) 2 % IJ SOLN: 10.0000 mL | Freq: Once | INTRAMUSCULAR | Status: DC

## 2016-05-10 MED ORDER — TETANUS-DIPHTH-ACELL PERTUSSIS 5-2.5-18.5 LF-MCG/0.5 IM SUSP
0.5000 mL | Freq: Once | INTRAMUSCULAR | Status: AC
  Administered 2016-05-10: 0.5 mL via INTRAMUSCULAR
  Filled 2016-05-10: qty 0.5

## 2016-05-10 MED ORDER — CEPHALEXIN 500 MG PO CAPS: 500.0000 mg | ORAL_CAPSULE | Freq: Two times a day (BID) | ORAL | 0 refills | Status: AC

## 2016-05-10 MED ORDER — IBUPROFEN 400 MG PO TABS
600.0000 mg | ORAL_TABLET | Freq: Once | ORAL | Status: DC
  Filled 2016-05-10: qty 1

## 2016-05-10 MED ORDER — LIDOCAINE HCL (PF) 2 % IJ SOLN
INTRAMUSCULAR | Status: DC
Start: 2016-05-10 — End: 2016-05-10
  Filled 2016-05-10: qty 4

## 2016-05-10 MED ORDER — LIDOCAINE-EPINEPHRINE (PF) 2 %-1:200000 IJ SOLN
INTRAMUSCULAR | Status: AC
  Filled 2016-05-10: qty 20

## 2016-05-10 NOTE — ED Triage Notes (Signed)
Finger laceration to right ring finger about 1 hour PTA. Lac about 1-2 in from the tip of the finger to the knuckle.  No bleeding noted.

## 2016-05-10 NOTE — ED Provider Notes (Signed)
[facility] Provider [facility]   CSN: [id number] Arrival date & time: 05/10/16  4098     History   Chief Complaint Chief Complaint  Patient presents with  . Laceration    HPI Benjamin Small is a 46 y.o. male.  HPI  46 year old male presents after suffering a right ring finger laceration at work. He states a wrench slipped and he sliced his finger on metal apparatus. The metal blade did not break. When I walked into the room he was soaking his hand in Betadine and states he seen flecks of metal coming out. He denies any weakness or numbness in his finger. Pain is currently a 6 or 7 out of 10. He's not sure when his last tetanus immunization was.  Past Medical History:  Diagnosis Date  . ADD (attention deficit disorder)   . Adult ADHD   . Anxiety disorder   . Carpal tunnel syndrome on left   . COPD (chronic obstructive pulmonary disease) (HCC)   . Essential hypertension 12/25/2014  . Hyperlipidemia   . Hypertension   . Long term use of drug   . Male erectile disorder   . Migraines   . Obesity   . OSA (obstructive sleep apnea) 12/25/2014  . Sleep apnea   . Testicular hypofunction   . Tobacco abuse 12/25/2014  . Vitamin D deficiency     Patient Active Problem List   Diagnosis Date Noted  . Essential hypertension 12/25/2014  . OSA (obstructive sleep apnea) 12/25/2014  . Tobacco abuse 12/25/2014  . Obesity 12/25/2014    Past Surgical History:  Procedure Laterality Date  . HEMORRHOID SURGERY         Home Medications    Prior to Admission medications   Medication Sig Start Date End Date Taking? Authorizing Provider  ACETAMINOPHEN-BUTALBITAL 50-650 MG TABS Take 1 tablet by mouth every 8 (eight) hours as needed. For headaches    Historical Provider, MD  ADDERALL XR 20 MG 24 hr capsule TAKE 2 CAPSULE BY MOUTH  IN THE MORNING FOR ADHD 11/03/14   Historical Provider, MD  ALPRAZolam Prudy Feeler) 1 MG tablet Take 1 mg by mouth 3 (three) times daily as needed.  anxiety    Historical Provider, MD  amLODipine (NORVASC) 10 MG tablet Take 10 mg by mouth daily.    Historical Provider, MD  carvedilol (COREG) 25 MG tablet Take 1 tablet (25 mg total) by mouth 2 (two) times daily with a meal. 12/25/14   Chilton Si, MD  cephALEXin (KEFLEX) 500 MG capsule Take 1 capsule (500 mg total) by mouth 2 (two) times daily. 05/10/16 05/15/16  Pricilla Loveless, MD  Cholecalciferol 5000 UNITS TABS Take 5,000 Units by mouth once a week.    Historical Provider, MD  fluticasone (FLONASE) 50 MCG/ACT nasal spray Place 1 spray into both nostrils daily as needed for allergies.     Historical Provider, MD  loratadine (CLARITIN) 10 MG tablet Take 10 mg by mouth daily as needed for allergies.     Historical Provider, MD  losartan-hydrochlorothiazide (HYZAAR) 100-25 MG tablet Take 1 tablet by mouth daily. 12/07/14   Pricilla Riffle, MD  Tadalafil (CIALIS) 2.5 MG TABS Take 1 tablet (2.5 mg total) by mouth daily as needed (Erectile Dysfunction). 12/25/14   Chilton Si, MD    Family History Family History  Problem Relation Age of Onset  . Cancer Father     SKIN  . Alcohol abuse Mother   . Cancer Maternal Grandmother   . Cancer Maternal  Grandfather   . Breast cancer Paternal Grandmother     Social History Social History  Substance Use Topics  . Smoking status: Current Every Day Smoker  . Smokeless tobacco: Not on file  . Alcohol use No     Allergies   Patient has no known allergies.   Review of Systems Review of Systems  Musculoskeletal: Positive for arthralgias.  Skin: Positive for wound.  Neurological: Negative for weakness and numbness.  All other systems reviewed and are negative.    Physical Exam Updated Vital Signs BP (!) 148/103 (BP Location: Left Arm)   Pulse 78   Temp 98.5 F (36.9 C) (Oral)   Resp 18   Ht 5\' 9"  (1.753 m)   Wt (!) 318 lb (144.2 kg)   SpO2 98%   BMI 46.96 kg/m   Physical Exam  Constitutional: He is oriented to person, place,  and time. He appears well-developed and well-nourished. No distress.  HENT:  Head: Normocephalic and atraumatic.  Right Ear: External ear normal.  Left Ear: External ear normal.  Nose: Nose normal.  Eyes: Right eye exhibits no discharge. Left eye exhibits no discharge.  Neck: Neck supple.  Cardiovascular: Normal rate and regular rhythm.   Pulmonary/Chest: Effort normal.  Musculoskeletal: He exhibits no edema.       Left hand: He exhibits laceration. He exhibits normal range of motion, no tenderness and no swelling. Normal sensation noted. Normal strength noted.       Hands: Superficial almost "S" shaped laceration to dorsal right ring finger. Stops proximal to nail. No nail injury or tenderness  Neurological: He is alert and oriented to person, place, and time.  Skin: Skin is warm and dry. He is not diaphoretic.  Nursing note and vitals reviewed.    ED Treatments / Results  Labs (all labs ordered are listed, but only abnormal results are displayed) Labs Reviewed - No data to display  EKG  EKG Interpretation None       Radiology Dg Finger Ring Right  Result Date: 05/10/2016 CLINICAL DATA:  Acute laceration injury to right ring finger today. EXAM: RIGHT RING FINGER 2+V COMPARISON:  None. FINDINGS: There is no evidence of fracture or dislocation. No radiopaque foreign body identified. There is no evidence of arthropathy or other focal bone abnormality. Soft tissues are unremarkable. IMPRESSION: Negative. Electronically Signed   By: Harmon PierJeffrey  Hu M.D.   On: 05/10/2016 10:04    Procedures .Marland Kitchen.Laceration Repair Date/Time: 05/10/2016 10:36 AM Performed by: Pricilla LovelessGOLDSTON, Clemencia Helzer Authorized by: Pricilla LovelessGOLDSTON, Kameah Rawl   Consent:    Consent obtained:  Verbal   Consent given by:  Patient Anesthesia (see MAR for exact dosages):    Anesthesia method:  Nerve block   Block needle gauge:  25 G   Block anesthetic:  Lidocaine 2% w/o epi   Block injection procedure:  Anatomic landmarks identified and  negative aspiration for blood   Block outcome:  Anesthesia achieved Laceration details:    Location:  Finger   Finger location:  R ring finger   Length (cm):  3.5 Repair type:    Repair type:  Simple Pre-procedure details:    Preparation:  Patient was prepped and draped in usual sterile fashion and imaging obtained to evaluate for foreign bodies Exploration:    Wound exploration: entire depth of wound probed and visualized     Contaminated: no   Treatment:    Area cleansed with:  Saline and Betadine   Amount of cleaning:  Extensive   Irrigation solution:  Sterile saline   Irrigation method:  Syringe Skin repair:    Repair method:  Sutures   Suture size:  4-0   Suture material:  Prolene   Suture technique:  Simple interrupted   Number of sutures:  7 Approximation:    Approximation:  Close   Vermilion border: well-aligned   Post-procedure details:    Dressing:  Antibiotic ointment, splint for protection and sterile dressing   Patient tolerance of procedure:  Tolerated well, no immediate complications   (including critical care time)  Medications Ordered in ED Medications  ibuprofen (ADVIL,MOTRIN) tablet 600 mg (not administered)  lidocaine (XYLOCAINE) 2 % injection 10 mL (not administered)  lidocaine-EPINEPHrine (XYLOCAINE W/EPI) 2 %-1:200000 (PF) injection (not administered)  lidocaine (XYLOCAINE) 2 % injection (not administered)  Tdap (BOOSTRIX) injection 0.5 mL (0.5 mLs Intramuscular Given 05/10/16 0917)     Initial Impression / Assessment and Plan / ED Course  I have reviewed the triage vital signs and the nursing notes.  Pertinent labs & imaging results that were available during my care of the patient were reviewed by me and considered in my medical decision making (see chart for details).  Clinical Course as of May 11 1035  Sun May 10, 2016  0904 Given the laceration occurred with metal will get xray. Plan to do digital block, irrigate, suture. No signs of acute  NV compromise. Laceration appears superficial  [SG]    Clinical Course User Index [SG] Pricilla Loveless, MD    Patient repaired as above. Given his hands are chronically greasy and dirty and he was working on [employer] during this time, will place on Keflex as prophylaxis for antibiotics. Tetanus immunization provided. No signs of neurovascular compromise. Wound is superficial, no foreign body seen on imaging or during exam.  Final Clinical Impressions(s) / ED Diagnoses   Final diagnoses:  Laceration of right ring finger without foreign body without damage to nail, initial encounter    New Prescriptions New Prescriptions   CEPHALEXIN (KEFLEX) 500 MG CAPSULE    Take 1 capsule (500 mg total) by mouth 2 (two) times daily.     Pricilla Loveless, MD 05/10/16 1038

## 2016-05-10 NOTE — ED Notes (Signed)
Patient transported to X-ray 

## 2016-07-20 IMAGING — NM NM MYOCAR MULTI W/ SPECT
3 series · 18 of 18 positions shown · non-contrast
Comparison: none

[Series 1: stress_(id)_sa · 6.5mm · 6.51mm/px · 6 of 64 frames shown (1 of 2)]
[frame 6/64]
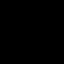
[frame 16/64]
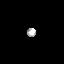
[frame 27/64]
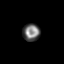
[frame 38/64]
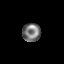
[frame 48/64]
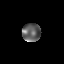
[frame 59/64]
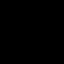

[Series 1: rest_(id)_sa · 6.5mm · 6.51mm/px · 6 of 64 frames shown]
[frame 6/64]
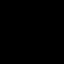
[frame 16/64]
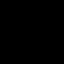
[frame 27/64]
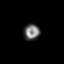
[frame 38/64]
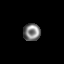
[frame 48/64]
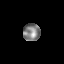
[frame 59/64]
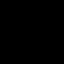

[Series 1: stress_(id)_sa · 6.5mm · 6.51mm/px · 6 of 512 frames shown (2 of 2)]
[frame 43/512]
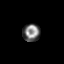
[frame 128/512]
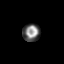
[frame 214/512]
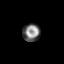
[frame 299/512]
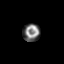
[frame 384/512]
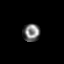
[frame 470/512]
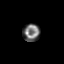

[18 of 18 positions shown; findings below may reference images not displayed]

Canned report from images found in remote index.

Refer to host system for actual result text.

## 2018-10-10 ENCOUNTER — Emergency Department (HOSPITAL_COMMUNITY)
Admission: EM | Admit: 2018-10-10 | Discharge: 2018-10-10 | Disposition: A | Discharge status: Home / Self Care | Payer: Self-pay | Attending: Emergency Medicine | Admitting: Emergency Medicine

## 2018-10-10 ENCOUNTER — Encounter (HOSPITAL_COMMUNITY): Payer: Self-pay | Admitting: Emergency Medicine

## 2018-10-10 ENCOUNTER — Other Ambulatory Visit: Payer: Self-pay

## 2018-10-10 DIAGNOSIS — I10 Essential (primary) hypertension: Secondary | ICD-10-CM | POA: Insufficient documentation

## 2018-10-10 DIAGNOSIS — B9789 Other viral agents as the cause of diseases classified elsewhere: Secondary | ICD-10-CM | POA: Insufficient documentation

## 2018-10-10 DIAGNOSIS — Z20828 Contact with and (suspected) exposure to other viral communicable diseases: Secondary | ICD-10-CM | POA: Insufficient documentation

## 2018-10-10 DIAGNOSIS — J988 Other specified respiratory disorders: Secondary | ICD-10-CM | POA: Insufficient documentation

## 2018-10-10 DIAGNOSIS — J449 Chronic obstructive pulmonary disease, unspecified: Secondary | ICD-10-CM | POA: Insufficient documentation

## 2018-10-10 DIAGNOSIS — Z79899 Other long term (current) drug therapy: Secondary | ICD-10-CM | POA: Insufficient documentation

## 2018-10-10 DIAGNOSIS — F17200 Nicotine dependence, unspecified, uncomplicated: Secondary | ICD-10-CM | POA: Insufficient documentation

## 2018-10-10 HISTORY — DX: Autoimmune thyroiditis: E06.3

## 2018-10-10 LAB — SARS CORONAVIRUS 2 (TAT 6-24 HRS): SARS Coronavirus 2: NEGATIVE

## 2018-10-10 NOTE — ED Provider Notes (Signed)
[facility] EMERGENCY DEPARTMENT Provider Note   CSN: [id number] Arrival date & time: 10/10/18  0809    History   Chief Complaint Chief Complaint  Patient presents with  . Sore Throat  . Cough    HPI Benjamin Small is a 48 y.o. male.     HPI   48 year old male presents today with complaints of sore throat and cough.  Patient notes approximate 1 week ago developed fatigue sore throat cough.  He notes that he works in an area where [place] has had an outbreak.  He notes he was tested 2 days after his symptoms started but was negative.  He notes that this was very superficial nose test and did not feel it was adequate.  He notes minor shortness of breath.  He notes the fever comes on at night up to proximately 101.  He went to work today and had temperature of 99.9 and was told he need to follow-up with his doctor.  Patient does note a history of Hashimoto's disease, but notes no other chronic health condition does not take any daily medications.  Chart review shows patient is on numerous medications including blood pressure medication.   Past Medical History:  Diagnosis Date  . ADD (attention deficit disorder)   . Adult ADHD   . Anxiety disorder   . Carpal tunnel syndrome on left   . COPD (chronic obstructive pulmonary disease) ([place])   . Essential hypertension 12/25/2014  . Hashimoto's disease   . Hyperlipidemia   . Hypertension   . Long term use of drug   . Male erectile disorder   . Migraines   . Obesity   . OSA (obstructive sleep apnea) 12/25/2014  . Sleep apnea   . Testicular hypofunction   . Tobacco abuse 12/25/2014  . Vitamin D deficiency     Patient Active Problem List   Diagnosis Date Noted  . Essential hypertension 12/25/2014  . OSA (obstructive sleep apnea) 12/25/2014  . Tobacco abuse 12/25/2014  . Obesity 12/25/2014    Past Surgical History:  Procedure Laterality Date  . HEMORRHOID SURGERY          Home Medications    Prior  to Admission medications   Medication Sig Start Date End Date Taking? Authorizing Provider  ACETAMINOPHEN-BUTALBITAL 50-650 MG TABS Take 1 tablet by mouth every 8 (eight) hours as needed. For headaches    [provider]  ADDERALL XR 20 MG 24 hr capsule TAKE 2 CAPSULE BY MOUTH  IN THE MORNING FOR ADHD 11/03/14   [provider]  ALPRAZolam Duanne Moron) 1 MG tablet Take 1 mg by mouth 3 (three) times daily as needed. anxiety    [provider]  amLODipine (NORVASC) 10 MG tablet Take 10 mg by mouth daily.    [provider]  carvedilol (COREG) 25 MG tablet Take 1 tablet (25 mg total) by mouth 2 (two) times daily with a meal. 12/25/14   Skeet Latch, MD  Cholecalciferol 5000 UNITS TABS Take 5,000 Units by mouth once a week.    [provider]  fluticasone (FLONASE) 50 MCG/ACT nasal spray Place 1 spray into both nostrils daily as needed for allergies.     [provider]  loratadine (CLARITIN) 10 MG tablet Take 10 mg by mouth daily as needed for allergies.     [provider]  losartan-hydrochlorothiazide (HYZAAR) 100-25 MG tablet Take 1 tablet by mouth daily. 12/07/14   Fay Records, MD  Tadalafil (CIALIS) 2.5 MG  TABS Take 1 tablet (2.5 mg total) by mouth daily as needed (Erectile Dysfunction). 12/25/14   Chilton Siandolph, Tiffany, MD    Family History Family History  Problem Relation Age of Onset  . Cancer Father        SKIN  . Alcohol abuse Mother   . Cancer Maternal Grandmother   . Cancer Maternal Grandfather   . Breast cancer Paternal Grandmother     Social History Social History   Tobacco Use  . Smoking status: Current Every Day Smoker  . Smokeless tobacco: Never Used  Substance Use Topics  . Alcohol use: No  . Drug use: No     Allergies   Patient has no known allergies.   Review of Systems Review of Systems  All other systems reviewed and are negative.    Physical Exam Updated Vital Signs BP (!) 142/94   Pulse 68    Temp 98.6 F (37 C) (Oral)   Resp 19   SpO2 95%   Physical Exam Vitals signs and nursing note reviewed.  Constitutional:      Appearance: He is well-developed.  HENT:     Head: Normocephalic and atraumatic.     Comments: Oropharynx is clear no erythema edema or exudate Eyes:     General: No scleral icterus.       Right eye: No discharge.        Left eye: No discharge.     Conjunctiva/sclera: Conjunctivae normal.     Pupils: Pupils are equal, round, and reactive to light.  Neck:     Musculoskeletal: Normal range of motion.     Vascular: No JVD.     Trachea: No tracheal deviation.  Pulmonary:     Effort: Pulmonary effort is normal. No respiratory distress.     Breath sounds: Normal breath sounds. No stridor. No wheezing, rhonchi or rales.  Neurological:     Mental Status: He is alert and oriented to person, place, and time.     Coordination: Coordination normal.  Psychiatric:        Behavior: Behavior normal.        Thought Content: Thought content normal.        Judgment: Judgment normal.      ED Treatments / Results  Labs (all labs ordered are listed, but only abnormal results are displayed) Labs Reviewed  SARS CORONAVIRUS 2    EKG None  Radiology No results found.  Procedures Procedures (including critical care time)  Medications Ordered in ED Medications - No data to display   Initial Impression / Assessment and Plan / ED Course  I have reviewed the triage vital signs and the nursing notes.  Pertinent labs & imaging results that were available during my care of the patient were reviewed by me and considered in my medical decision making (see chart for details).        :   Assessment/Plan: 48 year old male presents today with likely viral respiratory infection.  He has no signs of severe illness.  Is well-appearing no acute distress.  He is afebrile now.  Patient will have Cobra testing performed here as this is on our differential.  He has no  signs of bacterial infection at this time.  He is encouraged to follow-up with his primary care provider or return if he develops any new or worsening signs or symptoms.  Verbalized understanding and agreement to today's plan.   Benjamin Small was evaluated in Emergency Department on 10/10/2018 for the symptoms described in the  history of present illness. He was evaluated in the context of the global COVID-19 pandemic, which necessitated consideration that the patient might be at risk for infection with the SARS-CoV-2 virus that causes COVID-19. Institutional protocols and algorithms that pertain to the evaluation of patients at risk for COVID-19 are in a state of rapid change based on information released by regulatory bodies including the CDC and federal and state organizations. These policies and algorithms were followed during the patient's care in the ED.  Final Clinical Impressions(s) / ED Diagnoses   Final diagnoses:  Contact w and exposure to oth viral communicable diseases  Viral respiratory illness    ED Discharge Orders    None       Eyvonne MechanicHedges, Dnaiel Voller, PA-C 10/10/18 1435    Pricilla LovelessGoldston, Scott, MD 10/11/18 [phone]

## 2018-10-10 NOTE — Discharge Instructions (Addendum)
Please read attached information. If you experience any new or worsening signs or symptoms please return to the emergency room for evaluation. Please follow-up with your primary care provider or specialist as discussed.  °

## 2018-10-10 NOTE — ED Triage Notes (Addendum)
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

## 2018-10-14 ENCOUNTER — Encounter (HOSPITAL_COMMUNITY): Payer: Self-pay | Admitting: Emergency Medicine

## 2018-10-14 ENCOUNTER — Other Ambulatory Visit: Payer: Self-pay

## 2018-10-14 ENCOUNTER — Emergency Department (HOSPITAL_COMMUNITY)
Admission: EM | Admit: 2018-10-14 | Discharge: 2018-10-14 | Disposition: A | Discharge status: Home / Self Care | Payer: Self-pay | Attending: Emergency Medicine | Admitting: Emergency Medicine

## 2018-10-14 DIAGNOSIS — B349 Viral infection, unspecified: Secondary | ICD-10-CM | POA: Insufficient documentation

## 2018-10-14 DIAGNOSIS — R519 Headache, unspecified: Secondary | ICD-10-CM

## 2018-10-14 DIAGNOSIS — J449 Chronic obstructive pulmonary disease, unspecified: Secondary | ICD-10-CM | POA: Insufficient documentation

## 2018-10-14 DIAGNOSIS — Z79899 Other long term (current) drug therapy: Secondary | ICD-10-CM | POA: Insufficient documentation

## 2018-10-14 DIAGNOSIS — I10 Essential (primary) hypertension: Secondary | ICD-10-CM | POA: Insufficient documentation

## 2018-10-14 DIAGNOSIS — F17200 Nicotine dependence, unspecified, uncomplicated: Secondary | ICD-10-CM | POA: Insufficient documentation

## 2018-10-14 LAB — CBC WITH DIFFERENTIAL/PLATELET
Abs Immature Granulocytes: 0 10*3/uL (ref 0.00–0.07)
Band Neutrophils: 12 %
Basophils Absolute: 0 10*3/uL (ref 0.0–0.1)
Basophils Relative: 0 %
Eosinophils Absolute: 0.3 10*3/uL (ref 0.0–0.5)
Eosinophils Relative: 3 %
HCT: 47.1 % (ref 39.0–52.0)
Hemoglobin: 16.1 g/dL (ref 13.0–17.0)
Lymphocytes Relative: 32 %
Lymphs Abs: 2.9 10*3/uL (ref 0.7–4.0)
MCH: 30.2 pg (ref 26.0–34.0)
MCHC: 34.2 g/dL (ref 30.0–36.0)
MCV: 88.4 fL (ref 80.0–100.0)
Monocytes Absolute: 0.4 10*3/uL (ref 0.1–1.0)
Monocytes Relative: 4 %
Neutro Abs: 5.5 10*3/uL (ref 1.7–7.7)
Neutrophils Relative %: 49 %
Platelets: 245 10*3/uL (ref 150–400)
RBC: 5.33 MIL/uL (ref 4.22–5.81)
RDW: 13.2 % (ref 11.5–15.5)
WBC: 9 10*3/uL (ref 4.0–10.5)
nRBC: 0 % (ref 0.0–0.2)

## 2018-10-14 LAB — COMPREHENSIVE METABOLIC PANEL
ALT: 34 U/L (ref 0–44)
AST: 35 U/L (ref 15–41)
Albumin: 3.8 g/dL (ref 3.5–5.0)
Alkaline Phosphatase: 69 U/L (ref 38–126)
Anion gap: 10 (ref 5–15)
BUN: 8 mg/dL (ref 6–20)
CO2: 29 mmol/L (ref 22–32)
Calcium: 8.7 mg/dL — ABNORMAL LOW (ref 8.9–10.3)
Chloride: 98 mmol/L (ref 98–111)
Creatinine, Ser: 1.06 mg/dL (ref 0.61–1.24)
GFR calc Af Amer: >60 mL/min (ref >60)
GFR calc non Af Amer: >60 mL/min (ref >60)
Glucose, Bld: 105 mg/dL — ABNORMAL HIGH (ref 70–99)
Potassium: 3.5 mmol/L (ref 3.5–5.1)
Sodium: 137 mmol/L (ref 135–145)
Total Bilirubin: 0.7 mg/dL (ref 0.3–1.2)
Total Protein: 7.2 g/dL (ref 6.5–8.1)

## 2018-10-14 MED ORDER — DOXYCYCLINE HYCLATE 100 MG PO CAPS: 100.0000 mg | ORAL_CAPSULE | Freq: Two times a day (BID) | ORAL | 0 refills | Status: DC

## 2018-10-14 MED ORDER — OXYCODONE-ACETAMINOPHEN 5-325 MG PO TABS
1.0000 | ORAL_TABLET | Freq: Once | ORAL | Status: AC
  Administered 2018-10-14: 1 via ORAL
  Filled 2018-10-14: qty 1

## 2018-10-14 MED ORDER — DOXYCYCLINE HYCLATE 100 MG PO TABS
100.0000 mg | ORAL_TABLET | Freq: Once | ORAL | Status: AC
  Administered 2018-10-14: 100 mg via ORAL
  Filled 2018-10-14: qty 1

## 2018-10-14 NOTE — ED Provider Notes (Signed)
[place] EMERGENCY DEPARTMENT Provider Note   CSN: [id number] Arrival date & time: 10/14/18  1359     History   Chief Complaint Chief Complaint  Patient presents with  . Fever  . Headache    HPI Benjamin Small is a 48 y.o. male.     The history is provided by the patient and medical records. No language interpreter was used.  Fever Associated symptoms: headaches   Headache Associated symptoms: fever      48 year old male with history of anxiety, Hashimoto's disease, hypertension, obesity, presenting for evaluation of headache.  Patient report for the past 10 days he has had fever as high as 103, throbbing headache to the crown of his head, chills, decrease in appetite, nauseous, body aches, occasional congestion and occasional cough, and now developing a rash throughout his body.  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  He has been seen in the ED several times for his cold symptoms.  He has had two negative COVID-19 test.  His symptoms still persist and is becoming progressively worse.  He does not complain of any neck stiffness, focal numbness or focal weakness.  Patient also unsure if he has been bitten by spider either.  Patient also mention that his blood pressures been elevated despite taking his blood pressure medication.  Past Medical History:  Diagnosis Date  . ADD (attention deficit disorder)   . Adult ADHD   . Anxiety disorder   . Carpal tunnel syndrome on left   . COPD (chronic obstructive pulmonary disease) ([place])   . Essential hypertension 12/25/2014  . Hashimoto's disease   . Hyperlipidemia   . Hypertension   . Long term use of drug   . Male erectile disorder   . Migraines   . Obesity   . OSA (obstructive sleep apnea) 12/25/2014  . Sleep apnea   . Testicular hypofunction   . Tobacco abuse 12/25/2014  . Vitamin D  deficiency     Patient Active Problem List   Diagnosis Date Noted  . Essential hypertension 12/25/2014  . OSA (obstructive sleep apnea) 12/25/2014  . Tobacco abuse 12/25/2014  . Obesity 12/25/2014    Past Surgical History:  Procedure Laterality Date  . HEMORRHOID SURGERY          Home Medications    Prior to Admission medications   Medication Sig Start Date End Date Taking? Authorizing Provider  ACETAMINOPHEN-BUTALBITAL 50-650 MG TABS Take 1 tablet by mouth every 8 (eight) hours as needed. For headaches    [provider]  ADDERALL XR 20 MG 24 hr capsule TAKE 2 CAPSULE BY MOUTH  IN THE MORNING FOR ADHD 11/03/14   [provider]  ALPRAZolam Duanne Moron) 1 MG tablet Take 1 mg by mouth 3 (three) times daily as needed. anxiety    [provider]  amLODipine (NORVASC) 10 MG tablet Take 10 mg by mouth daily.    [provider]  carvedilol (COREG) 25 MG tablet Take 1 tablet (25 mg total) by mouth 2 (two) times daily with a meal. 12/25/14   Skeet Latch, MD  Cholecalciferol 5000 UNITS TABS Take 5,000 Units by mouth once a week.    [provider]  fluticasone (FLONASE) 50 MCG/ACT nasal spray Place 1 spray into both nostrils daily as needed for allergies.     [provider]  loratadine (CLARITIN) 10 MG tablet Take 10 mg by mouth daily as needed for allergies.     [provider]  losartan-hydrochlorothiazide (HYZAAR) 100-25 MG tablet Take 1 tablet by mouth daily. 12/07/14   Pricilla Riffleoss, Paula V, MD  Tadalafil (CIALIS) 2.5 MG TABS Take 1 tablet (2.5 mg total) by mouth daily as needed (Erectile Dysfunction). 12/25/14   Chilton Siandolph, Tiffany, MD    Family History Family History  Problem Relation Age of Onset  . Cancer Father        SKIN  . Alcohol abuse Mother   . Cancer Maternal Grandmother   . Cancer Maternal Grandfather   . Breast cancer Paternal Grandmother     Social History Social History   Tobacco Use  . Smoking status:  Current Every Day Smoker  . Smokeless tobacco: Never Used  Substance Use Topics  . Alcohol use: No  . Drug use: No     Allergies   Patient has no known allergies.   Review of Systems Review of Systems  Constitutional: Positive for fever.  Neurological: Positive for headaches.  All other systems reviewed and are negative.    Physical Exam Updated Vital Signs BP (!) 174/139   Pulse 80   Temp 99 F (37.2 C) (Oral)   Resp 20   SpO2 96%   Physical Exam Vitals signs and nursing note reviewed.  Constitutional:      General: He is not in acute distress.    Appearance: He is well-developed. He is obese.  HENT:     Head: Atraumatic.     Mouth/Throat:     Mouth: Mucous membranes are moist.  Eyes:     Extraocular Movements: Extraocular movements intact.     Right eye: Normal extraocular motion.     Left eye: Normal extraocular motion.     Conjunctiva/sclera: Conjunctivae normal.     Pupils: Pupils are equal, round, and reactive to light.  Neck:     Musculoskeletal: Normal range of motion and neck supple. No neck rigidity.  Cardiovascular:     Rate and Rhythm: Normal rate and regular rhythm.     Heart sounds: Normal heart sounds.  Pulmonary:     Effort: Pulmonary effort is normal.     Breath sounds: Normal breath sounds. No wheezing, rhonchi or rales.  Abdominal:     Palpations: Abdomen is soft.     Tenderness: There is no abdominal tenderness.  Musculoskeletal: Normal range of motion.  Lymphadenopathy:     Cervical: No cervical adenopathy.  Skin:    Findings: No rash (Scattered erythematous macular rash noted throughout body, blanchable no rash on palms of hands or soles of feet no mucosal involvement).  Neurological:     Mental Status: He is alert and oriented to person, place, and time.     GCS: GCS eye subscore is 4. GCS verbal subscore is 5. GCS motor subscore is 6.     Cranial Nerves: Cranial nerves are intact.     Sensory: Sensation is intact.     Motor:  Motor function is intact.     Coordination: Coordination is intact.     Gait: Gait is intact.  Psychiatric:        Mood and Affect: Mood normal.      ED Treatments / Results  Labs (all labs ordered are listed, but only abnormal results are displayed) Labs Reviewed  COMPREHENSIVE METABOLIC PANEL - Abnormal; Notable for the following components:      Result Value  Glucose, Bld 105 (*)    Calcium 8.7 (*)    All other components within normal limits  CBC WITH DIFFERENTIAL/PLATELET  ROCKY MTN SPOTTED FVR ABS PNL(IGG+IGM)  LYME DISEASE DNA BY PCR(BORRELIA BURG)    EKG None  Radiology No results found.  Procedures Procedures (including critical care time)  Medications Ordered in ED Medications  doxycycline (VIBRA-TABS) tablet 100 mg (100 mg Oral Given 10/14/18 1614)  oxyCODONE-acetaminophen (PERCOCET/ROXICET) 5-325 MG per tablet 1 tablet (1 tablet Oral Given 10/14/18 1614)     Initial Impression / Assessment and Plan / ED Course  I have reviewed the triage vital signs and the nursing notes.  Pertinent labs & imaging results that were available during my care of the patient were reviewed by me and considered in my medical decision making (see chart for details).        BP (!) 174/139   Pulse 81   Temp 99 F (37.2 C) (Oral)   Resp 20   SpO2 96%    Final Clinical Impressions(s) / ED Diagnoses   Final diagnoses:  Viral illness  Bad headache    ED Discharge Orders         Ordered    doxycycline (VIBRAMYCIN) 100 MG capsule  2 times daily     10/14/18 1650         3:34 PM Patient has had fever chills body aches dizzy nausea and now developing a rash.  Symptom concerning for potential [facility] fever as he has spotted a tick on his skin several days prior to the start of his symptoms.  Thorough skin examination reveals no obvious presence of take however he does have a puncture wound noted to his right lower leg near the ankle.  No bull's-eye rash.   Plan to treat with doxycycline and will obtain [facility] and [facility] fever titer as well as basic labs.  Patient has headache however no evidence of meningeal sign to suggest meningitis.  Lungs are clear on auscultation.  He has had two negative COVID-19 test.   Fayrene Helperran, Ikey Omary, PA-C 10/14/18 1656    Margarita Grizzleay, Danielle, MD 10/15/18 1001

## 2018-10-14 NOTE — Discharge Instructions (Signed)
You have been evaluated for your rash, fever and headache. Your infection may be due to tick borne illness.  Take antibiotic as prescribed.  You will be notify if you test positive for either Lyme or [facility] Spotted Fever.  Follow up with your doctor for further care.  Return if you have any concerns.

## 2018-10-14 NOTE — ED Triage Notes (Signed)
Pt was seen on 8/5 & 8/10 for fever and headache pt has had 2 negative covid test  Here at ED. Pt continues to have fevers and body aches. Pt recalls 3 weeks he had ticks crawling up his legs and brushed them off he did not know if one had bite him but he does have a sore on on his right ankle and a bilateral rash on both legs.

## 2018-10-18 LAB — ROCKY MTN SPOTTED FVR ABS PNL(IGG+IGM)
RMSF IgG: NEGATIVE
RMSF IgM: 0.3 index (ref 0.00–0.89)

## 2018-10-19 LAB — LYME DISEASE DNA BY PCR(BORRELIA BURG): Lyme Disease(B.burgdorferi)PCR: NEGATIVE

## 2019-01-30 ENCOUNTER — Emergency Department (HOSPITAL_COMMUNITY): Payer: Commercial Managed Care - PPO

## 2019-01-30 ENCOUNTER — Other Ambulatory Visit: Payer: Self-pay

## 2019-01-30 ENCOUNTER — Emergency Department (HOSPITAL_COMMUNITY)
Admission: EM | Admit: 2019-01-30 | Discharge: 2019-01-30 | Disposition: A | Discharge status: Home / Self Care | Payer: Commercial Managed Care - PPO | Attending: Emergency Medicine | Admitting: Emergency Medicine

## 2019-01-30 ENCOUNTER — Encounter (HOSPITAL_COMMUNITY): Payer: Self-pay | Admitting: Emergency Medicine

## 2019-01-30 DIAGNOSIS — Z20822 Contact with and (suspected) exposure to covid-19: Secondary | ICD-10-CM

## 2019-01-30 DIAGNOSIS — R05 Cough: Secondary | ICD-10-CM | POA: Diagnosis present

## 2019-01-30 DIAGNOSIS — Z20828 Contact with and (suspected) exposure to other viral communicable diseases: Secondary | ICD-10-CM | POA: Insufficient documentation

## 2019-01-30 DIAGNOSIS — I10 Essential (primary) hypertension: Secondary | ICD-10-CM | POA: Diagnosis not present

## 2019-01-30 DIAGNOSIS — F17210 Nicotine dependence, cigarettes, uncomplicated: Secondary | ICD-10-CM | POA: Diagnosis not present

## 2019-01-30 DIAGNOSIS — J449 Chronic obstructive pulmonary disease, unspecified: Secondary | ICD-10-CM | POA: Diagnosis not present

## 2019-01-30 NOTE — ED Notes (Signed)
Pt given dc instructions pt verbalizes understanding.  

## 2019-01-30 NOTE — Discharge Instructions (Signed)
You were evaluated in the Emergency Department and after careful evaluation, we did not find any emergent condition requiring admission or further testing in the hospital.  Your exam/testing today is overall reassuring.  Your EKG and chest x-ray were reassuring today.  Your symptoms seem to be due to a virus, possibly the coronavirus.  We have tested you for the coronavirus here in the Emergency Department.  Please isolate or quarantine at home until you receive a negative test result.  If positive, we recommend continued home quarantine per [facility] recommendations.   Please return to the Emergency Department if you experience any worsening of your condition.  We encourage you to follow up with a primary care provider.  Thank you for allowing [place] to be a part of your care.

## 2019-01-30 NOTE — ED Triage Notes (Signed)
Pt reports cough since last Wednesday. Denies recent fever. Current smoker. Reports some SOB.

## 2019-01-30 NOTE — ED Provider Notes (Signed)
[facility]EMER[facility]Y DEPT [facility] Emergency Department Provider Note MRN:  [id number]  Arrival date & time: 01/30/19     Chief Complaint   Cough  History of Present Illness   Benjamin Small is a 48 y.o. year-old male with a history of COPD presenting to the ED with chief complaint of cough.  5 days of cough, runny nose.  Multiple sick contacts/Covid exposures.  Mild shortness of breath this morning.  Denies chest pain, no fever, no abdominal pain, no vomiting, no diarrhea.  Review of Systems  A complete 10 system review of systems was obtained and all systems are negative except as noted in the HPI and PMH.   Patient's Health History    Past Medical History:  Diagnosis Date  . ADD (attention deficit disorder)   . Adult ADHD   . Anxiety disorder   . Carpal tunnel syndrome on left   . COPD (chronic obstructive pulmonary disease) ([facility])   . Essential hypertension 12/25/2014  . Hashimoto's disease   . Hyperlipidemia   . Hypertension   . Long term use of drug   . Male erectile disorder   . Migraines   . Obesity   . OSA (obstructive sleep apnea) 12/25/2014  . Sleep apnea   . Testicular hypofunction   . Tobacco abuse 12/25/2014  . Vitamin D deficiency     Past Surgical History:  Procedure Laterality Date  . HEMORRHOID SURGERY      Family History  Problem Relation Age of Onset  . Cancer Father        SKIN  . Alcohol abuse Mother   . Cancer Maternal Grandmother   . Cancer Maternal Grandfather   . Breast cancer Paternal Grandmother     Social History   Socioeconomic History  . Marital status: Married    Spouse name: Not on file  . Number of children: Not on file  . Years of education: Not on file  . Highest education level: Not on file  Occupational History  . Not on file  Social Needs  . Financial resource strain: Not on file  . Food insecurity    Worry: Not on file    Inability: Not on file  . Transportation needs    Medical: Not on file   Non-medical: Not on file  Tobacco Use  . Smoking status: Current Every Day Smoker    Packs/day: 1.00  . Smokeless tobacco: Never Used  Substance and Sexual Activity  . Alcohol use: Yes    Comment: occ  . Drug use: No  . Sexual activity: Not on file  Lifestyle  . Physical activity    Days per week: Not on file    Minutes per session: Not on file  . Stress: Not on file  Relationships  . Social Musician on phone: Not on file    Gets together: Not on file    Attends religious service: Not on file    Active member of club or organization: Not on file    Attends meetings of clubs or organizations: Not on file    Relationship status: Not on file  . Intimate partner violence    Fear of current or ex partner: Not on file    Emotionally abused: Not on file    Physically abused: Not on file    Forced sexual activity: Not on file  Other Topics Concern  . Not on file  Social History Narrative  . Not on file  Physical Exam  Vital Signs and Nursing Notes reviewed Vitals:   01/30/19 0736  BP: (!) 195/115  Pulse: 72  Resp: 16  Temp: 98.3 F (36.8 C)  SpO2: 97%    CONSTITUTIONAL: Well-appearing, NAD NEURO:  Alert and oriented x 3, no focal deficits EYES:  eyes equal and reactive ENT/NECK:  no LAD, no JVD CARDIO: Regular rate, well-perfused, normal S1 and S2 PULM:  CTAB no wheezing or rhonchi GI/GU:  normal bowel sounds, non-distended, non-tender MSK/SPINE:  No gross deformities, no edema SKIN:  no rash, atraumatic PSYCH:  Appropriate speech and behavior  Diagnostic and Interventional Summary    EKG Interpretation  Date/Time:  Monday January 30 2019 09:08:48 EST Ventricular Rate:  78 PR Interval:    QRS Duration: 98 QT Interval:  410 QTC Calculation: 467 R Axis:   30 Text Interpretation: Sinus rhythm LAE, consider biatrial enlargement No significant change was found Confirmed by Benjamin Small [phone]) on 01/30/2019 9:48:16 AM      Labs Reviewed   NOVEL CORONAVIRUS, NAA (HOSP ORDER, SEND-OUT TO REF LAB; TAT 18-24 HRS)    DG Chest Portable 1 View  Final Result      Medications - No data to display   Procedures  /  Critical Care Procedures  ED Course and Medical Decision Making  I have reviewed the triage vital signs and the nursing notes.  Pertinent labs & imaging results that were available during my care of the patient were reviewed by me and considered in my medical decision making (see below for details).     Suspect COVID-19 given his exposure and symptoms.  No tachycardia, no increased work of breathing, clear lungs, no evidence of DVT, little to no concern for pulmonary embolism.  Screening with EKG and chest x-ray, anticipating discharge with home quarantine instructions.  Benjamin Small was evaluated in Emergency Department on 01/30/2019 for the symptoms described in the history of present illness. He was evaluated in the context of the global COVID-19 pandemic, which necessitated consideration that the patient might be at risk for infection with the SARS-CoV-2 virus that causes COVID-19. Institutional protocols and algorithms that pertain to the evaluation of patients at risk for COVID-19 are in a state of rapid change based on information released by regulatory bodies including the CDC and federal and state organizations. These policies and algorithms were followed during the patient's care in the ED.  EKG and chest x-ray are reassuring, patient continues to have normal vital signs, he is appropriate for discharge and home quarantine.  Barth Kirks. [place] Small, [name] [email or web address]  Final Clinical Impressions(s) / ED Diagnoses     ICD-10-CM   1. Suspected COVID-19 virus infection  Z20.828     ED Discharge Orders    None       Discharge Instructions Discussed with and Provided to Patient:     Discharge Instructions     You were evaluated in the  Emergency Department and after careful evaluation, we did not find any emergent condition requiring admission or further testing in the hospital.  Your exam/testing today is overall reassuring.  Your EKG and chest x-ray were reassuring today.  Your symptoms seem to be due to a virus, possibly the coronavirus.  We have tested you for the coronavirus here in the Emergency Department.  Please isolate or quarantine at home until you receive a negative test result.  If positive, we recommend continued home quarantine per [facility] recommendations.  Please return to the Emergency Department if you experience any worsening of your condition.  We encourage you to follow up with a primary care provider.  Thank you for allowing us to be a part of your care.       Sabas SousBero, Josefa [place] M, MD 01/30/19 [phone]

## 2019-01-31 LAB — NOVEL CORONAVIRUS, NAA (HOSP ORDER, SEND-OUT TO REF LAB; TAT 18-24 HRS): SARS-CoV-2, NAA: NOT DETECTED

## 2021-07-25 ENCOUNTER — Inpatient Hospital Stay (HOSPITAL_COMMUNITY)
Admission: EM | Admit: 2021-07-25 | Discharge: 2021-07-30 | DRG: 286 | Disposition: A | Discharge status: Home / Self Care | Payer: Medicaid Other | Attending: Internal Medicine | Admitting: Internal Medicine

## 2021-07-25 ENCOUNTER — Emergency Department (HOSPITAL_COMMUNITY): Payer: Medicaid Other

## 2021-07-25 ENCOUNTER — Encounter (HOSPITAL_COMMUNITY): Payer: Self-pay | Admitting: Emergency Medicine

## 2021-07-25 ENCOUNTER — Other Ambulatory Visit: Payer: Self-pay

## 2021-07-25 DIAGNOSIS — Z79899 Other long term (current) drug therapy: Secondary | ICD-10-CM

## 2021-07-25 DIAGNOSIS — E119 Type 2 diabetes mellitus without complications: Secondary | ICD-10-CM

## 2021-07-25 DIAGNOSIS — I5033 Acute on chronic diastolic (congestive) heart failure: Secondary | ICD-10-CM

## 2021-07-25 DIAGNOSIS — E876 Hypokalemia: Secondary | ICD-10-CM | POA: Diagnosis not present

## 2021-07-25 DIAGNOSIS — R778 Other specified abnormalities of plasma proteins: Principal | ICD-10-CM

## 2021-07-25 DIAGNOSIS — I110 Hypertensive heart disease with heart failure: Principal | ICD-10-CM | POA: Diagnosis present

## 2021-07-25 DIAGNOSIS — E039 Hypothyroidism, unspecified: Secondary | ICD-10-CM

## 2021-07-25 DIAGNOSIS — G4733 Obstructive sleep apnea (adult) (pediatric): Secondary | ICD-10-CM

## 2021-07-25 DIAGNOSIS — J449 Chronic obstructive pulmonary disease, unspecified: Secondary | ICD-10-CM | POA: Diagnosis present

## 2021-07-25 DIAGNOSIS — I10 Essential (primary) hypertension: Secondary | ICD-10-CM | POA: Diagnosis not present

## 2021-07-25 DIAGNOSIS — E559 Vitamin D deficiency, unspecified: Secondary | ICD-10-CM | POA: Diagnosis present

## 2021-07-25 DIAGNOSIS — R079 Chest pain, unspecified: Secondary | ICD-10-CM | POA: Diagnosis not present

## 2021-07-25 DIAGNOSIS — E785 Hyperlipidemia, unspecified: Secondary | ICD-10-CM

## 2021-07-25 DIAGNOSIS — Z6841 Body Mass Index (BMI) 40.0 and over, adult: Secondary | ICD-10-CM

## 2021-07-25 DIAGNOSIS — F17210 Nicotine dependence, cigarettes, uncomplicated: Secondary | ICD-10-CM | POA: Diagnosis present

## 2021-07-25 DIAGNOSIS — E1165 Type 2 diabetes mellitus with hyperglycemia: Secondary | ICD-10-CM | POA: Diagnosis not present

## 2021-07-25 DIAGNOSIS — E669 Obesity, unspecified: Secondary | ICD-10-CM | POA: Diagnosis present

## 2021-07-25 DIAGNOSIS — Z720 Tobacco use: Secondary | ICD-10-CM

## 2021-07-25 DIAGNOSIS — E038 Other specified hypothyroidism: Secondary | ICD-10-CM

## 2021-07-25 DIAGNOSIS — E063 Autoimmune thyroiditis: Secondary | ICD-10-CM

## 2021-07-25 DIAGNOSIS — F419 Anxiety disorder, unspecified: Secondary | ICD-10-CM | POA: Diagnosis present

## 2021-07-25 DIAGNOSIS — F909 Attention-deficit hyperactivity disorder, unspecified type: Secondary | ICD-10-CM | POA: Diagnosis present

## 2021-07-25 LAB — CBC
HCT: 43.8 % (ref 39.0–52.0)
HCT: 44.6 % (ref 39.0–52.0)
Hemoglobin: 15 g/dL (ref 13.0–17.0)
Hemoglobin: 15.2 g/dL (ref 13.0–17.0)
MCH: 30.5 pg (ref 26.0–34.0)
MCH: 31 pg (ref 26.0–34.0)
MCHC: 33.6 g/dL (ref 30.0–36.0)
MCHC: 34.7 g/dL (ref 30.0–36.0)
MCV: 89.2 fL (ref 80.0–100.0)
MCV: 90.7 fL (ref 80.0–100.0)
Platelets: 244 10*3/uL (ref 150–400)
Platelets: 248 10*3/uL (ref 150–400)
RBC: 4.91 MIL/uL (ref 4.22–5.81)
RBC: 4.92 MIL/uL (ref 4.22–5.81)
RDW: 13.5 % (ref 11.5–15.5)
RDW: 13.5 % (ref 11.5–15.5)
WBC: 10.6 10*3/uL — ABNORMAL HIGH (ref 4.0–10.5)
WBC: 10.8 10*3/uL — ABNORMAL HIGH (ref 4.0–10.5)
nRBC: 0 % (ref 0.0–0.2)
nRBC: 0 % (ref 0.0–0.2)

## 2021-07-25 LAB — BASIC METABOLIC PANEL
Anion gap: 10 (ref 5–15)
BUN: 25 mg/dL — ABNORMAL HIGH (ref 6–20)
CO2: 27 mmol/L (ref 22–32)
Calcium: 9.1 mg/dL (ref 8.9–10.3)
Chloride: 102 mmol/L (ref 98–111)
Creatinine, Ser: 1.12 mg/dL (ref 0.61–1.24)
GFR, Estimated: >60 mL/min (ref >60)
Glucose, Bld: 143 mg/dL — ABNORMAL HIGH (ref 70–99)
Potassium: 3.1 mmol/L — ABNORMAL LOW (ref 3.5–5.1)
Sodium: 139 mmol/L (ref 135–145)

## 2021-07-25 LAB — TROPONIN I (HIGH SENSITIVITY): Troponin I (High Sensitivity): 35 ng/L — ABNORMAL HIGH (ref <18)

## 2021-07-25 MED ORDER — FUROSEMIDE 20 MG PO TABS
20.0000 mg | ORAL_TABLET | Freq: Every evening | ORAL | Status: DC
Start: 2021-07-26 — End: 2021-07-27
  Administered 2021-07-26: 20 mg via ORAL
  Filled 2021-07-25: qty 1

## 2021-07-25 MED ORDER — NICOTINE 21 MG/24HR TD PT24
21.0000 mg | MEDICATED_PATCH | Freq: Every day | TRANSDERMAL | Status: DC
  Administered 2021-07-25 – 2021-07-30 (×6): 21 mg via TRANSDERMAL
  Filled 2021-07-25 (×6): qty 1

## 2021-07-25 MED ORDER — ENOXAPARIN SODIUM 80 MG/0.8ML IJ SOSY
80.0000 mg | PREFILLED_SYRINGE | INTRAMUSCULAR | Status: DC
  Administered 2021-07-26 – 2021-07-28 (×3): 80 mg via SUBCUTANEOUS
  Filled 2021-07-25 (×4): qty 0.8

## 2021-07-25 MED ORDER — ALBUTEROL SULFATE (2.5 MG/3ML) 0.083% IN NEBU: 2.5000 mg | INHALATION_SOLUTION | Freq: Four times a day (QID) | RESPIRATORY_TRACT | Status: DC | PRN

## 2021-07-25 MED ORDER — ALPRAZOLAM 0.5 MG PO TABS
1.0000 mg | ORAL_TABLET | Freq: Three times a day (TID) | ORAL | Status: DC | PRN
  Administered 2021-07-27 – 2021-07-29 (×5): 1 mg via ORAL
  Filled 2021-07-25 (×5): qty 2

## 2021-07-25 MED ORDER — ONDANSETRON HCL 4 MG PO TABS: 4.0000 mg | ORAL_TABLET | Freq: Four times a day (QID) | ORAL | Status: DC | PRN

## 2021-07-25 MED ORDER — ASPIRIN 81 MG PO CHEW
324.0000 mg | CHEWABLE_TABLET | Freq: Once | ORAL | Status: DC
  Filled 2021-07-25: qty 4

## 2021-07-25 MED ORDER — CARVEDILOL 25 MG PO TABS
25.0000 mg | ORAL_TABLET | Freq: Two times a day (BID) | ORAL | Status: DC
  Administered 2021-07-26 – 2021-07-30 (×10): 25 mg via ORAL
  Filled 2021-07-25 (×10): qty 1

## 2021-07-25 MED ORDER — SPIRONOLACTONE 25 MG PO TABS
25.0000 mg | ORAL_TABLET | Freq: Every evening | ORAL | Status: DC
  Administered 2021-07-26 – 2021-07-29 (×5): 25 mg via ORAL
  Filled 2021-07-25 (×6): qty 1

## 2021-07-25 MED ORDER — LISINOPRIL 20 MG PO TABS
40.0000 mg | ORAL_TABLET | Freq: Every evening | ORAL | Status: DC
  Administered 2021-07-26 – 2021-07-29 (×5): 40 mg via ORAL
  Filled 2021-07-25 (×5): qty 2

## 2021-07-25 MED ORDER — ACETAMINOPHEN 650 MG RE SUPP: 650.0000 mg | Freq: Four times a day (QID) | RECTAL | Status: DC | PRN

## 2021-07-25 MED ORDER — ONDANSETRON HCL 4 MG/2ML IJ SOLN: 4.0000 mg | Freq: Four times a day (QID) | INTRAMUSCULAR | Status: DC | PRN

## 2021-07-25 MED ORDER — ASPIRIN 81 MG PO TBEC
81.0000 mg | DELAYED_RELEASE_TABLET | Freq: Every day | ORAL | Status: DC
  Administered 2021-07-26 – 2021-07-28 (×3): 81 mg via ORAL
  Filled 2021-07-25 (×4): qty 1

## 2021-07-25 MED ORDER — ESCITALOPRAM OXALATE 10 MG PO TABS
20.0000 mg | ORAL_TABLET | Freq: Every day | ORAL | Status: DC
  Administered 2021-07-26 – 2021-07-30 (×5): 20 mg via ORAL
  Filled 2021-07-25 (×5): qty 2

## 2021-07-25 MED ORDER — HYDRALAZINE HCL 20 MG/ML IJ SOLN: 10.0000 mg | INTRAMUSCULAR | Status: DC | PRN

## 2021-07-25 MED ORDER — TAMSULOSIN HCL 0.4 MG PO CAPS
0.4000 mg | ORAL_CAPSULE | Freq: Every evening | ORAL | Status: DC
  Administered 2021-07-26 – 2021-07-29 (×4): 0.4 mg via ORAL
  Filled 2021-07-25 (×4): qty 1

## 2021-07-25 MED ORDER — LEVOTHYROXINE SODIUM 100 MCG PO TABS
200.0000 ug | ORAL_TABLET | Freq: Every day | ORAL | Status: DC
  Administered 2021-07-26 – 2021-07-30 (×5): 200 ug via ORAL
  Filled 2021-07-25 (×5): qty 2

## 2021-07-25 MED ORDER — ACETAMINOPHEN 325 MG PO TABS
650.0000 mg | ORAL_TABLET | Freq: Four times a day (QID) | ORAL | Status: DC | PRN
  Administered 2021-07-26: 650 mg via ORAL
  Filled 2021-07-25: qty 2

## 2021-07-25 MED ORDER — AMLODIPINE BESYLATE 10 MG PO TABS
10.0000 mg | ORAL_TABLET | Freq: Every evening | ORAL | Status: DC
  Administered 2021-07-26 – 2021-07-28 (×4): 10 mg via ORAL
  Filled 2021-07-25 (×4): qty 1

## 2021-07-25 MED ORDER — POTASSIUM CHLORIDE CRYS ER 20 MEQ PO TBCR
40.0000 meq | EXTENDED_RELEASE_TABLET | Freq: Once | ORAL | Status: AC
  Administered 2021-07-25: 40 meq via ORAL
  Filled 2021-07-25: qty 2

## 2021-07-25 NOTE — ED Provider Triage Note (Signed)
Emergency Medicine Provider Triage Evaluation Note  Teller Wakefield , a 51 y.o. male  was evaluated in triage.  Pt complains of chest pain which is exertional in nature, that is central to left-sided associated with shortness of breath for the last 4 days.  Patient was seen and evaluated at a another emergency department, had initial mildly elevated troponin with return to normal, he had a CTA which was negative for aortic dissection at the time.  He did have some new T wave inversions on his EKG at that time.  Patient had been recommended to be admitted for type II NSTEMI/unstable angina work-up but declined at the time due to fear for losing his job.  He reports today from cardiology appointment because he is having ongoing symptoms that are similar to chest pain from 4 days ago.  Patient reports with exertion he will often have a chest pounding sensation that feels like someone is punching him in the chest.  He denies nausea, vomiting, diaphoresis, syncope.  He reports he has had a heart attack, stroke before.  He does not take Plavix at this time.  Notably he does take Cialis, do not provide nitroglycerin.  He also takes a number of other blood pressure medications, reports he has not missed any doses, he arrives hypertensive with blood pressure 208/118.  Review of Systems  Positive: Chest pain, shob Negative: Nv, syncope  Physical Exam  BP (!) 208/118 (BP Location: Left Arm)   Pulse 70   Temp 98.3 F (36.8 C) (Oral)   Resp 20   Ht 5\' 9"  (1.753 m)   Wt (!) 158.8 kg   SpO2 96%   BMI 51.69 kg/m  Gen:   Awake, no distress   Resp:  Normal effort  MSK:   Moves extremities without difficulty  Other:    Medical Decision Making  Medically screening exam initiated at 9:33 PM.  Appropriate orders placed.  was informed that the remainder of the evaluation will be completed by another provider, this initial triage assessment does not replace that evaluation, and the importance of  remaining in the ED until their evaluation is complete.  Workup initiated   Lowella Fairy, Olene Floss 07/25/21 2135

## 2021-07-25 NOTE — ED Triage Notes (Signed)
Pt reported to ED with c/o mid to left sided chest pain x4 days with intermittent nausea. Pt also endorses severe shortness of breath that increases with exertion.

## 2021-07-25 NOTE — ED Provider Notes (Signed)
[facility] EMERGENCY DEPARTMENT Provider Note   CSN: [id number] Arrival date & time: 07/25/21  2112     History  Chief Complaint  Patient presents with   Chest Pain    Benjamin Small is a 50 y.o. male.  HPI 51 year old male presents with chest pain. He was sent by his cardiologist at [facility].   Home Medications Prior to Admission medications   Medication Sig Start Date End Date Taking? Authorizing Provider  ACETAMINOPHEN-BUTALBITAL 50-650 MG TABS Take 1 tablet by mouth every 8 (eight) hours as needed. For headaches    [provider]  ADDERALL XR 20 MG 24 hr capsule TAKE 2 CAPSULE BY MOUTH  IN THE MORNING FOR ADHD 11/03/14   [provider]  ALPRAZolam Prudy Feeler) 1 MG tablet Take 1 mg by mouth 3 (three) times daily as needed. anxiety    [provider]  amLODipine (NORVASC) 10 MG tablet Take 10 mg by mouth daily.    [provider]  carvedilol (COREG) 25 MG tablet Take 1 tablet (25 mg total) by mouth 2 (two) times daily with a meal. 12/25/14   Chilton Si, MD  Cholecalciferol 5000 UNITS TABS Take 5,000 Units by mouth once a week.    [provider]  doxycycline (VIBRAMYCIN) 100 MG capsule Take 1 capsule (100 mg total) by mouth 2 (two) times daily. One po bid x 7 days 10/14/18   Fayrene Helper, PA-C  fluticasone [facility]) 50 MCG/ACT nasal spray Place 1 spray into both nostrils daily as needed for allergies.     [provider]  loratadine (CLARITIN) 10 MG tablet Take 10 mg by mouth daily as needed for allergies.     [provider]  losartan-hydrochlorothiazide (HYZAAR) 100-25 MG tablet Take 1 tablet by mouth daily. 12/07/14   Pricilla Riffle, MD  Tadalafil (CIALIS) 2.5 MG TABS Take 1 tablet (2.5 mg total) by mouth daily as needed (Erectile Dysfunction). 12/25/14   Chilton Si, MD      Allergies    Patient has no known allergies.    Review of Systems   Review of Systems  Respiratory:   Positive for shortness of breath.   Cardiovascular:  Positive for chest pain and leg swelling.  Gastrointestinal:  Negative for abdominal pain.   Physical Exam Updated Vital Signs BP (!) 171/98   Pulse 64   Temp 98.3 F (36.8 C) (Oral)   Resp (!) 24   Ht 5\' 9"  (1.753 m)   Wt (!) 158.8 kg   SpO2 97%   BMI 51.69 kg/m  Physical Exam Vitals and nursing note reviewed.  Constitutional:      General: He is not in acute distress.    Appearance: He is well-developed. He is obese. He is not ill-appearing or diaphoretic.  HENT:     Head: Normocephalic and atraumatic.  Cardiovascular:     Rate and Rhythm: Normal rate and regular rhythm.     Heart sounds: Normal heart sounds.  Pulmonary:     Effort: Pulmonary effort is normal.     Breath sounds: Normal breath sounds.  Abdominal:     Palpations: Abdomen is soft.     Tenderness: There is no abdominal tenderness.  Musculoskeletal:     Right lower leg: Edema present.     Left lower leg: Edema present.     Comments: Mild edema to both ankles  Skin:    General: Skin is warm and dry.  Neurological:  Mental Status: He is alert.    ED Results / Procedures / Treatments   Labs (all labs ordered are listed, but only abnormal results are displayed) Labs Reviewed  BASIC METABOLIC PANEL - Abnormal; Notable for the following components:      Result Value   Potassium 3.1 (*)    Glucose, Bld 143 (*)    BUN 25 (*)    All other components within normal limits  CBC - Abnormal; Notable for the following components:   WBC 10.8 (*)    All other components within normal limits  TROPONIN I (HIGH SENSITIVITY) - Abnormal; Notable for the following components:   Troponin I (High Sensitivity) 35 (*)    All other components within normal limits    EKG EKG Interpretation  Date/Time:  Friday Jul 25 2021 21:24:33 EDT Ventricular Rate:  63 PR Interval:  176 QRS Duration: 98 QT Interval:  418 QTC Calculation: 427 R Axis:   48 Text  Interpretation: Normal sinus rhythm Right atrial enlargement ST & T wave abnormality, consider inferolateral ischemia ST/T changes worse when compared to Nov 2020 Confirmed by Pricilla Loveless [phone]) on 07/25/2021 9:44:57 PM  Radiology DG Chest 2 View  Result Date: 07/25/2021 CLINICAL DATA:  Chest pain. EXAM: CHEST - 2 VIEW COMPARISON:  Chest x-ray 11/26/2020 FINDINGS: Heart is borderline enlarged. Lungs and costophrenic angles are clear. No pneumothorax or acute fracture. IMPRESSION: No active cardiopulmonary disease. Electronically Signed   By: Darliss Cheney M.D.   On: 07/25/2021 22:10    Procedures Procedures    Medications Ordered in ED Medications  aspirin chewable tablet 324 mg (324 mg Oral Not Given 07/25/21 2236)  potassium chloride SA (KLOR-CON M) CR tablet 40 mEq (40 mEq Oral Given 07/25/21 2232)    ED Course/ Medical Decision Making/ A&P                           Medical Decision Making Amount and/or Complexity of Data Reviewed Independent Historian: spouse External Data Reviewed: notes. Labs: ordered. Radiology: ordered and independent interpretation performed. ECG/medicine tests: ordered and independent interpretation performed.  Risk OTC drugs. Prescription drug management.   Chart reviewed, was just at [facility] and had a negative CTA but mildly elevated troponin levels.  He was recommended to be admitted but declined.  At this point, he is not having active chest pain.  Troponin is 35.  ECG is not normal but with no active chest pain I do not suspect he is currently having infarction.  I discussed with Dr. Park Breed of cardiology who recommends no heparin for now but admission to hospitalist and they will consult and likely cath tomorrow.  Discussed with Dr. Marikay Alar for admission.  He was given aspirin by his cardiologist.  We will replace his potassium here.  Chest xray images viewed/interpreted by myself, no edema. No current chest pain.        Final Clinical  Impression(s) / ED Diagnoses Final diagnoses:  Elevated troponin    Rx / DC Orders ED Discharge Orders     None         Pricilla Loveless, MD 07/25/21 2307

## 2021-07-25 NOTE — ED Notes (Signed)
Patient to room after xray

## 2021-07-25 NOTE — H&P (Signed)
History and Physical  Patient Name: Benjamin Small     [id number]    DOB: 1970-12-13    DOA: 07/25/2021 PCP: Pcp, No  Patient coming from: Cardiology office  Chief Complaint: Chest pain    HPI: Benjamin Small is a 51 y.o. male, with PMH of ADD, anxiety, hypertension, hypothyroidism, dyslipidemia, OSA, tobacco use who presented to the ER on 07/25/2021 with chest pain.  Patient went to his PCPs office and his systolic blood pressures in the 220s.  He went home, took his medications then had some retrosternal chest pain, diaphoresis, and some dyspnea.  He presented to outside facility.  He had a CTA of chest abdomen pelvis that showed mild hepatic steatosis however no signs of aortic dissection.  He decided to leave AMA at that time because he could not miss work.  His troponin was only mildly elevated at that time.  However since then he has had on and off substernal chest pain describes as almost a sore muscle.  He went to his cardiologist's office today who told him to go to the ER.  He says he has had a heart attack in the past however did not get stented, question NSTEMI?  He does say he has a history of stroke as well.  He was recently diagnosed with diabetes, hemoglobin A1c 6.7.  He was started on metformin however could not tolerate due to diarrhea.    ED course: -Vitals on admission: Afebrile, heart rate 70, blood pressure 208/118, maintaining sats on room air -Labs on initial presentation: Sodium 139, potassium 3.1, bicarb 27, glucose 143, BUN 25, creatinine 1.12, troponin 35, WBC 10.8, hemoglobin 15 -Imaging obtained on admission: Chest x-ray unremarkable for any acute processes -In the ED the patient was given potassium, did receive full dose aspirin at doctor's office, and the hospitalist service was contacted for further evaluation and management.     ROS: A complete and thorough 12 point review of systems obtained, negative listed in HPI.     Past Medical History:   Diagnosis Date   ADD (attention deficit disorder)    Adult ADHD    Anxiety disorder    Carpal tunnel syndrome on left    COPD (chronic obstructive pulmonary disease) ([place])    Essential hypertension 12/25/2014   Hashimoto's disease    Hyperlipidemia    Hypertension    Long term use of drug    Male erectile disorder    Migraines    Obesity    OSA (obstructive sleep apnea) 12/25/2014   Sleep apnea    Testicular hypofunction    Tobacco abuse 12/25/2014   Vitamin D deficiency     Past Surgical History:  Procedure Laterality Date   HEMORRHOID SURGERY      Social History: Patient lives at home.  The patient walks without assistance.  Current smoker.  No Known Allergies  Family history: family history includes Alcohol abuse in his mother; Breast cancer in his paternal grandmother; Cancer in his father, maternal grandfather, and maternal grandmother.  Prior to Admission medications   Medication Sig Start Date End Date Taking? Authorizing Provider  ACETAMINOPHEN-BUTALBITAL 50-650 MG TABS Take 1 tablet by mouth every 8 (eight) hours as needed. For headaches    [provider]  ADDERALL XR 20 MG 24 hr capsule TAKE 2 CAPSULE BY MOUTH  IN THE MORNING FOR ADHD 11/03/14   [provider]  ALPRAZolam Duanne Moron) 1 MG tablet Take 1 mg by mouth 3 (three) times daily as needed. anxiety  [provider]  amLODipine (NORVASC) 10 MG tablet Take 10 mg by mouth daily.    [provider]  carvedilol (COREG) 25 MG tablet Take 1 tablet (25 mg total) by mouth 2 (two) times daily with a meal. 12/25/14   Chilton Si, MD  Cholecalciferol 5000 UNITS TABS Take 5,000 Units by mouth once a week.    [provider]  doxycycline (VIBRAMYCIN) 100 MG capsule Take 1 capsule (100 mg total) by mouth 2 (two) times daily. One po bid x 7 days 10/14/18   Fayrene Helper, PA-C  fluticasone [facility]) 50 MCG/ACT nasal spray Place 1 spray into both nostrils daily as needed for  allergies.     [provider]  loratadine (CLARITIN) 10 MG tablet Take 10 mg by mouth daily as needed for allergies.     [provider]  losartan-hydrochlorothiazide (HYZAAR) 100-25 MG tablet Take 1 tablet by mouth daily. 12/07/14   Pricilla Riffle, MD  Tadalafil (CIALIS) 2.5 MG TABS Take 1 tablet (2.5 mg total) by mouth daily as needed (Erectile Dysfunction). 12/25/14   Chilton Si, MD       Physical Exam: BP (!) 171/98   Pulse 64   Temp 98.3 F (36.8 C) (Oral)   Resp (!) 24   Ht 5\' 9"  (1.753 m)   Wt (!) 158.8 kg   SpO2 97%   BMI 51.69 kg/m   General appearance: Well-developed, adult male, alert and in no acute distress .   Lymph: No cervical or supraclavicular lymphadenopathy. Skin: Warm and dry.  No jaundice.  No suspicious rashes or lesions. Cardiac: RRR, nl S1-S2, no murmurs appreciated.  No LE edema.  Radial and pedal pulses 2+ and symmetric. Respiratory: Normal respiratory rate and rhythm.  CTAB without rales or wheezes. Abdomen: Abdomen soft.  No tenderness with palpation. No ascites, distension, hepatosplenomegaly.   MSK: No deformities or effusions of the large joints of the upper or lower extremities bilaterally.  No cyanosis or clubbing. Neuro: Cranial nerves 2 through 12 grossly intact.  Sensation intact to light touch. Speech is fluent.  Marland Kitchen    Psych: Sensorium intact and responding to questions, attention normal.  Behavior appropriate.  Judgment and insight appear normal.    Labs on Admission:  I have personally reviewed following labs and imaging studies: CBC: Recent Labs  Lab 07/25/21 2135  WBC 10.8*  HGB 15.0  HCT 44.6  MCV 90.7  PLT 244   Basic Metabolic Panel: Recent Labs  Lab 07/25/21 2135  NA 139  K 3.1*  CL 102  CO2 27  GLUCOSE 143*  BUN 25*  CREATININE 1.12  CALCIUM 9.1   GFR: Estimated Creatinine Clearance: 120.8 mL/min (by C-G formula based on SCr of 1.12 mg/dL).  Liver Function Tests: No results for input(s):  AST, ALT, ALKPHOS, BILITOT, PROT, ALBUMIN in the last 168 hours. No results for input(s): LIPASE, AMYLASE in the last 168 hours. No results for input(s): AMMONIA in the last 168 hours. Coagulation Profile: No results for input(s): INR, PROTIME in the last 168 hours. Cardiac Enzymes: No results for input(s): CKTOTAL, CKMB, CKMBINDEX, TROPONINI in the last 168 hours. BNP (last 3 results) No results for input(s): PROBNP in the last 8760 hours. HbA1C: No results for input(s): HGBA1C in the last 72 hours. CBG: No results for input(s): GLUCAP in the last 168 hours. Lipid Profile: No results for input(s): CHOL, HDL, LDLCALC, TRIG, CHOLHDL, LDLDIRECT in the last 72 hours. Thyroid Function Tests: No results for input(s):  TSH, T4TOTAL, FREET4, T3FREE, THYROIDAB in the last 72 hours. Anemia Panel: No results for input(s): VITAMINB12, FOLATE, FERRITIN, TIBC, IRON, RETICCTPCT in the last 72 hours.   No results found for this or any previous visit (from the past 240 hour(s)).         Radiological Exams on Admission: Personally reviewed imaging which shows: Chest x-ray on admission showed no acute processes. DG Chest 2 View  Result Date: 07/25/2021 CLINICAL DATA:  Chest pain. EXAM: CHEST - 2 VIEW COMPARISON:  Chest x-ray 11/26/2020 FINDINGS: Heart is borderline enlarged. Lungs and costophrenic angles are clear. No pneumothorax or acute fracture. IMPRESSION: No active cardiopulmonary disease. Electronically Signed   By: Ronney Asters M.D.   On: 07/25/2021 22:10         Assessment/Plan   1.  Chest pain, unspecified -Patient has had nonspecific chest pain that comes and goes over the past few days - Patient tells me with a history of MRI, but did not have cardiac cath?  NSTEMI? - Significant risk factors including diabetes, tobacco abuse, hypertension, dyslipidemia - ED consulted cards in the ER -Aspirin daily - Trend troponins - Not on home statin that I can tell, lipid profile  ordered - Telemetry - Echocardiogram ordered - N.p.o. for possible cardiac cath in the morning  2.  Hypertension -Patient's blood pressure has been poorly controlled for some time per patient.  I am sure amphetamines for his ADD are not helping - Resume home Coreg, lisinopril, spironolactone, amlodipine - Continue close monitoring  3.  Hypokalemia -On admission potassium 3.1.  Supplemented - Magnesium ordered - Follow-up labs ordered  4.  Tobacco use -Recommend cessation - Nicotine patch ordered  5.  Hypothyroidism -Continue home Synthroid - TSH ordered  6.  OSA -Continue CPAP at night     DVT prophylaxis: Lovenox Code Status: Full Family Communication: Wife at bedside Disposition Plan: Anticipate discharge home when medically optimized Consults called: Cards consulted by ER Admission status: Observation with telemetry   At the point of initial evaluation, it is my clinical opinion that admission for OBSERVATION is reasonable and necessary because the patient's presenting complaints in the context of their chronic conditions represent sufficient risk of deterioration or significant morbidity to constitute reasonable grounds for close observation in the hospital setting, but that the patient may be medically stable for discharge from the hospital within 24 to 48 hours.    Medical decision making: Patient seen at 11:03 PM on 07/25/2021.  The patient was discussed with ER provider.  What exists of the patient's chart was reviewed in depth and summarized above.  Clinical condition: Fair.        Doran Heater [facility] Please page though [place] or Epic secure chat:  For password, contact charge nurse

## 2021-07-26 ENCOUNTER — Observation Stay (HOSPITAL_COMMUNITY): Payer: Medicaid Other

## 2021-07-26 ENCOUNTER — Other Ambulatory Visit (HOSPITAL_COMMUNITY): Payer: Medicaid Other

## 2021-07-26 DIAGNOSIS — Z79899 Other long term (current) drug therapy: Secondary | ICD-10-CM | POA: Diagnosis not present

## 2021-07-26 DIAGNOSIS — E876 Hypokalemia: Secondary | ICD-10-CM

## 2021-07-26 DIAGNOSIS — I10 Essential (primary) hypertension: Secondary | ICD-10-CM | POA: Diagnosis not present

## 2021-07-26 DIAGNOSIS — R072 Precordial pain: Secondary | ICD-10-CM | POA: Diagnosis not present

## 2021-07-26 DIAGNOSIS — R079 Chest pain, unspecified: Secondary | ICD-10-CM | POA: Diagnosis not present

## 2021-07-26 DIAGNOSIS — F909 Attention-deficit hyperactivity disorder, unspecified type: Secondary | ICD-10-CM | POA: Diagnosis present

## 2021-07-26 DIAGNOSIS — E785 Hyperlipidemia, unspecified: Secondary | ICD-10-CM

## 2021-07-26 DIAGNOSIS — F17210 Nicotine dependence, cigarettes, uncomplicated: Secondary | ICD-10-CM | POA: Diagnosis present

## 2021-07-26 DIAGNOSIS — E559 Vitamin D deficiency, unspecified: Secondary | ICD-10-CM | POA: Diagnosis present

## 2021-07-26 DIAGNOSIS — E039 Hypothyroidism, unspecified: Secondary | ICD-10-CM

## 2021-07-26 DIAGNOSIS — R778 Other specified abnormalities of plasma proteins: Secondary | ICD-10-CM | POA: Diagnosis present

## 2021-07-26 DIAGNOSIS — Z6841 Body Mass Index (BMI) 40.0 and over, adult: Secondary | ICD-10-CM | POA: Diagnosis not present

## 2021-07-26 DIAGNOSIS — I110 Hypertensive heart disease with heart failure: Secondary | ICD-10-CM | POA: Diagnosis present

## 2021-07-26 DIAGNOSIS — G4733 Obstructive sleep apnea (adult) (pediatric): Secondary | ICD-10-CM | POA: Diagnosis present

## 2021-07-26 DIAGNOSIS — I249 Acute ischemic heart disease, unspecified: Secondary | ICD-10-CM | POA: Diagnosis not present

## 2021-07-26 DIAGNOSIS — I5033 Acute on chronic diastolic (congestive) heart failure: Secondary | ICD-10-CM | POA: Diagnosis present

## 2021-07-26 DIAGNOSIS — J449 Chronic obstructive pulmonary disease, unspecified: Secondary | ICD-10-CM | POA: Diagnosis present

## 2021-07-26 DIAGNOSIS — E1165 Type 2 diabetes mellitus with hyperglycemia: Secondary | ICD-10-CM | POA: Diagnosis not present

## 2021-07-26 DIAGNOSIS — F419 Anxiety disorder, unspecified: Secondary | ICD-10-CM | POA: Diagnosis present

## 2021-07-26 LAB — PHOSPHORUS
Phosphorus: 3.3 mg/dL (ref 2.5–4.6)
Phosphorus: 4.4 mg/dL (ref 2.5–4.6)

## 2021-07-26 LAB — COMPREHENSIVE METABOLIC PANEL
ALT: 31 U/L (ref 0–44)
AST: 23 U/L (ref 15–41)
Albumin: 3.6 g/dL (ref 3.5–5.0)
Alkaline Phosphatase: 68 U/L (ref 38–126)
Anion gap: 6 (ref 5–15)
BUN: 23 mg/dL — ABNORMAL HIGH (ref 6–20)
CO2: 28 mmol/L (ref 22–32)
Calcium: 8.9 mg/dL (ref 8.9–10.3)
Chloride: 105 mmol/L (ref 98–111)
Creatinine, Ser: 1.23 mg/dL (ref 0.61–1.24)
GFR, Estimated: >60 mL/min (ref >60)
Glucose, Bld: 126 mg/dL — ABNORMAL HIGH (ref 70–99)
Potassium: 3.2 mmol/L — ABNORMAL LOW (ref 3.5–5.1)
Sodium: 139 mmol/L (ref 135–145)
Total Bilirubin: 0.3 mg/dL (ref 0.3–1.2)
Total Protein: 6.4 g/dL — ABNORMAL LOW (ref 6.5–8.1)

## 2021-07-26 LAB — LIPID PANEL
Cholesterol: 150 mg/dL (ref 0–200)
HDL: 32 mg/dL — ABNORMAL LOW (ref >40)
LDL Cholesterol: 98 mg/dL (ref 0–99)
Total CHOL/HDL Ratio: 4.7 RATIO
Triglycerides: 101 mg/dL (ref <150)
VLDL: 20 mg/dL (ref 0–40)

## 2021-07-26 LAB — GLUCOSE, CAPILLARY
Glucose-Capillary: 125 mg/dL — ABNORMAL HIGH (ref 70–99)
Glucose-Capillary: 154 mg/dL — ABNORMAL HIGH (ref 70–99)
Glucose-Capillary: 159 mg/dL — ABNORMAL HIGH (ref 70–99)
Glucose-Capillary: 175 mg/dL — ABNORMAL HIGH (ref 70–99)
Glucose-Capillary: 202 mg/dL — ABNORMAL HIGH (ref 70–99)

## 2021-07-26 LAB — ECHOCARDIOGRAM COMPLETE
AR max vel: 2.91 cm2
AV Area VTI: 3.38 cm2
AV Area mean vel: 3.06 cm2
AV Mean grad: 4 mmHg
AV Peak grad: 9 mmHg
Ao pk vel: 1.5 m/s
Area-P 1/2: 2.94 cm2
Height: 69 in
MV VTI: 2.69 cm2
S' Lateral: 4.3 cm
Weight: 5600 oz

## 2021-07-26 LAB — CREATININE, SERUM
Creatinine, Ser: 1.2 mg/dL (ref 0.61–1.24)
GFR, Estimated: >60 mL/min (ref >60)

## 2021-07-26 LAB — TROPONIN I (HIGH SENSITIVITY)
Troponin I (High Sensitivity): 33 ng/L — ABNORMAL HIGH (ref <18)
Troponin I (High Sensitivity): 37 ng/L — ABNORMAL HIGH (ref <18)

## 2021-07-26 LAB — HIV ANTIBODY (ROUTINE TESTING W REFLEX): HIV Screen 4th Generation wRfx: NONREACTIVE

## 2021-07-26 LAB — CBC
HCT: 40.5 % (ref 39.0–52.0)
Hemoglobin: 14.1 g/dL (ref 13.0–17.0)
MCH: 30.7 pg (ref 26.0–34.0)
MCHC: 34.8 g/dL (ref 30.0–36.0)
MCV: 88.2 fL (ref 80.0–100.0)
Platelets: 228 10*3/uL (ref 150–400)
RBC: 4.59 MIL/uL (ref 4.22–5.81)
RDW: 13.6 % (ref 11.5–15.5)
WBC: 9.3 10*3/uL (ref 4.0–10.5)
nRBC: 0 % (ref 0.0–0.2)

## 2021-07-26 LAB — PROTIME-INR
INR: 1 (ref 0.8–1.2)
Prothrombin Time: 12.7 seconds (ref 11.4–15.2)

## 2021-07-26 LAB — TSH: TSH: 6.446 u[IU]/mL — ABNORMAL HIGH (ref 0.350–4.500)

## 2021-07-26 LAB — MAGNESIUM
Magnesium: 2 mg/dL (ref 1.7–2.4)
Magnesium: 2.2 mg/dL (ref 1.7–2.4)

## 2021-07-26 MED ORDER — PERFLUTREN LIPID MICROSPHERE
1.0000 mL | INTRAVENOUS | Status: AC | PRN
  Administered 2021-07-26: 5 mL via INTRAVENOUS

## 2021-07-26 MED ORDER — ATORVASTATIN CALCIUM 40 MG PO TABS
40.0000 mg | ORAL_TABLET | Freq: Every day | ORAL | Status: DC
  Administered 2021-07-26 – 2021-07-29 (×4): 40 mg via ORAL
  Filled 2021-07-26 (×4): qty 1

## 2021-07-26 NOTE — Assessment & Plan Note (Signed)
continue statin

## 2021-07-26 NOTE — Assessment & Plan Note (Addendum)
--  TSH 6.446. follow-up as an outpatient. Continue levothyroxine

## 2021-07-26 NOTE — Assessment & Plan Note (Signed)
recommend cessation

## 2021-07-26 NOTE — Plan of Care (Signed)

## 2021-07-26 NOTE — Assessment & Plan Note (Signed)
--  continue CPAP

## 2021-07-26 NOTE — Progress Notes (Signed)
  Progress Note   Patient: Benjamin Small WUJ:[id number] DOB: Jul 20, 1970 DOA: 07/25/2021     0 DOS: the patient was seen and examined on 07/26/2021   Brief hospital course: 50yom PMH OSA, ADD, HTN presented with retrosternal chest pain, diaphoresis and shortness of breath in the context of significantly elevated systolic blood pressure.  Had a CTA of the chest abdomen pelvis with no aortic dissection.  Left AMA from presenting facility for.  Continues to have substernal chest pain, seen by cardiology and sent to the emergency department, admitted for further evaluation of chest pain.  Cardiology plans cardiac catheterization.  Assessment and Plan: * Chest pain --Echocardiogram pending, catheterization planned Tuesday by cardiology, no heparin for now per cardiology. Troponins flat in 30s. Continue antihypertensives, aspirin, statin.  Hypokalemia --replete. Mg 2+ WNL.  Hypothyroidism --TSH 6.446. Continue levothyroxine   Essential hypertension --continue carvedilol, Lasix, lisinopril, spironolactone, amlodipine  Dyslipidemia --continue statin  Tobacco abuse --recommend cessation  OSA (obstructive sleep apnea) --continue CPAP        Subjective:  Feels ok now  Physical Exam: Vitals:   07/25/21 2315 07/26/21 0015 07/26/21 0225 07/26/21 0533  BP: (!) 176/111 (!) 182/110 132/73 (!) 155/101  Pulse: 63 62  (!) 58  Resp: 16 16  16   Temp:  98.1 F (36.7 C)  (!) 97.5 F (36.4 C)  TempSrc:  Oral  Axillary  SpO2: 97% 97%  94%  Weight:      Height:       Physical Exam Vitals reviewed.  Constitutional:      General: He is not in acute distress.    Appearance: He is not ill-appearing or toxic-appearing.  Cardiovascular:     Rate and Rhythm: Normal rate and regular rhythm.     Heart sounds: No murmur heard.    Comments: Telemetry SB Pulmonary:     Effort: Pulmonary effort is normal. No respiratory distress.     Breath sounds: No wheezing, rhonchi or rales.   Neurological:     Mental Status: He is alert.  Psychiatric:        Mood and Affect: Mood normal.        Behavior: Behavior normal.    Data Reviewed:  CBG 150-200s K+ 3.2 Troponins flat  Family Communication: none  Disposition: Status is: Observation   Planned Discharge Destination: Home    Time spent: 25 minutes  Author: , MD 07/26/2021 12:48 PM  For on call review [email or web address]/[email or web address].

## 2021-07-26 NOTE — Plan of Care (Signed)
  Problem: Clinical Measurements: Goal: Cardiovascular complication will be avoided Outcome: Progressing   Problem: Activity: Goal: Risk for activity intolerance will decrease Outcome: Progressing   Problem: Coping: Goal: Level of anxiety will decrease Outcome: Progressing   Problem: Pain Managment: Goal: General experience of comfort will improve Outcome: Progressing   Problem: Safety: Goal: Ability to remain free from injury will improve Outcome: Progressing   

## 2021-07-26 NOTE — Progress Notes (Signed)
*  PRELIMINARY RESULTS* Echocardiogram 2D Echocardiogram has been performed.  Benjamin Small 07/26/2021, 4:05 PM

## 2021-07-26 NOTE — Assessment & Plan Note (Addendum)
--  repleted. Mg 2+ WNL.

## 2021-07-26 NOTE — Assessment & Plan Note (Addendum)
--  Echocardiogram showed LVEF 60-65%, no WMA, catheterization planned Tuesday by cardiology, no heparin for now per cardiology. Troponins flat in 30s. Continue antihypertensives, aspirin, statin.

## 2021-07-26 NOTE — Hospital Course (Signed)
50yom PMH OSA, ADD, HTN presented with retrosternal chest pain, diaphoresis and shortness of breath in the context of significantly elevated systolic blood pressure.  Had a CTA of the chest abdomen pelvis with no aortic dissection.  Left AMA.  Continued to have substernal chest pain, seen by cardiology and sent to the emergency department, admitted 5/26 for further evaluation of chest pain. ACS ruled out. Now s/p LHC 5/30 with normal coronaries. Plan medical management. Home 5/31.

## 2021-07-26 NOTE — Assessment & Plan Note (Addendum)
--  remains elevated; continue carvedilol, Lasix, lisinopril, spironolactone, amlodipine; hydralazine added by cardiology

## 2021-07-26 NOTE — Consult Note (Signed)
CARDIOLOGY CONSULT NOTE       Patient ID: Benjamin Small MRN: [id number] DOB/AGE: 1970-05-19 51 y.o.  Admit date: 07/25/2021 Referring Physician: Marikay Alar Primary Physician: Pcp, No Primary Cardiologist: [facility] Reason for Consultation: Chest Pain   Principal Problem:   Chest pain Active Problems:   Essential hypertension   OSA (obstructive sleep apnea)   Tobacco abuse   Obesity   Hypothyroidism   Dyslipidemia   Hypokalemia   HPI:  51 y.o. asked to see by Dr Marikay Alar for chest pain. History of OSA/nasal CPAP, obsesity, ADD, HTN, HLD and smoking Seen by PCP yesterday with elevated BP Had SSCP with diaphoresis and dyspnea CTA no dissection left AMA Pain recurs and seen by Dr Hanley Hays and told to go back to ER No pain free ECG with inferior T wave changes Troponin mildly elevated but flat 35->37->33 Pain free this am   ROS All other systems reviewed and negative except as noted above  Past Medical History:  Diagnosis Date   ADD (attention deficit disorder)    Adult ADHD    Anxiety disorder    Carpal tunnel syndrome on left    COPD (chronic obstructive pulmonary disease) (HCC)    Essential hypertension 12/25/2014   Hashimoto's disease    Hyperlipidemia    Hypertension    Long term use of drug    Male erectile disorder    Migraines    Obesity    OSA (obstructive sleep apnea) 12/25/2014   Sleep apnea    Testicular hypofunction    Tobacco abuse 12/25/2014   Vitamin D deficiency     Family History  Problem Relation Age of Onset   Cancer Father        SKIN   Alcohol abuse Mother    Cancer Maternal Grandmother    Cancer Maternal Grandfather    Breast cancer Paternal Grandmother     Social History   Socioeconomic History   Marital status: Married    Spouse name: Not on file   Number of children: Not on file   Years of education: Not on file   Highest education level: Not on file  Occupational History   Not on file  Tobacco Use   Smoking status:  Every Day    Packs/day: 1.00    Types: Cigarettes   Smokeless tobacco: Never  Substance and Sexual Activity   Alcohol use: Yes    Comment: occ   Drug use: No   Sexual activity: Not on file  Other Topics Concern   Not on file  Social History Narrative   Not on file   Social Determinants of Health   Financial Resource Strain: Not on file  Food Insecurity: Not on file  Transportation Needs: Not on file  Physical Activity: Not on file  Stress: Not on file  Social Connections: Not on file  Intimate Partner Violence: Not on file    Past Surgical History:  Procedure Laterality Date   HEMORRHOID SURGERY        Current Facility-Administered Medications:    acetaminophen (TYLENOL) tablet 650 mg, 650 mg, Oral, Q6H PRN **OR** acetaminophen (TYLENOL) suppository 650 mg, 650 mg, Rectal, Q6H PRN, MacNeil, Richard G, DO   albuterol (PROVENTIL) (2.5 MG/3ML) 0.083% nebulizer solution 2.5 mg, 2.5 mg, Nebulization, Q6H PRN, MacNeil, Richard G, DO   ALPRAZolam Prudy Feeler) tablet 1 mg, 1 mg, Oral, TID PRN, MacNeil, Richard G, DO   amLODipine (NORVASC) tablet 10 mg, 10 mg, Oral, QPM, MacNeil, Richard G, DO, 10 mg  at 07/26/21 0035   aspirin chewable tablet 324 mg, 324 mg, Oral, Once, Laqueta DueMacNeil, Richard G, DO   aspirin EC tablet 81 mg, 81 mg, Oral, Daily, MacNeil, Richard G, DO   carvedilol (COREG) tablet 25 mg, 25 mg, Oral, BID WC, MacNeil, Richard G, DO, 25 mg at 07/26/21 0035   enoxaparin (LOVENOX) injection 80 mg, 80 mg, Subcutaneous, Q24H, MacNeil, Richard G, DO   escitalopram (LEXAPRO) tablet 20 mg, 20 mg, Oral, Daily, MacNeil, Richard G, DO   furosemide (LASIX) tablet 20 mg, 20 mg, Oral, QPM, MacNeil, Richard G, DO   hydrALAZINE (APRESOLINE) injection 10 mg, 10 mg, Intravenous, Q4H PRN, MacNeil, Richard G, DO   levothyroxine (SYNTHROID) tablet 200 mcg, 200 mcg, Oral, Q0600, Laqueta DueMacNeil, Richard G, DO, 200 mcg at 07/26/21 0643   lisinopril (ZESTRIL) tablet 40 mg, 40 mg, Oral, QPM, MacNeil, Richard G,  DO, 40 mg at 07/26/21 0035   nicotine (NICODERM CQ - dosed in mg/24 hours) patch 21 mg, 21 mg, Transdermal, Daily, MacNeil, Richard G, DO, 21 mg at 07/25/21 2357   ondansetron (ZOFRAN) tablet 4 mg, 4 mg, Oral, Q6H PRN **OR** ondansetron (ZOFRAN) injection 4 mg, 4 mg, Intravenous, Q6H PRN, MacNeil, Richard G, DO   spironolactone (ALDACTONE) tablet 25 mg, 25 mg, Oral, QPM, MacNeil, Richard G, DO, 25 mg at 07/26/21 0035   tamsulosin (FLOMAX) capsule 0.4 mg, 0.4 mg, Oral, QPM, MacNeil, Richard G, DO  amLODipine  10 mg Oral QPM   aspirin  324 mg Oral Once   aspirin EC  81 mg Oral Daily   carvedilol  25 mg Oral BID WC   enoxaparin (LOVENOX) injection  80 mg Subcutaneous Q24H   escitalopram  20 mg Oral Daily   furosemide  20 mg Oral QPM   levothyroxine  200 mcg Oral Q0600   lisinopril  40 mg Oral QPM   nicotine  21 mg Transdermal Daily   spironolactone  25 mg Oral QPM   tamsulosin  0.4 mg Oral QPM     Physical Exam: Blood pressure (!) 155/101, pulse (!) 58, temperature (!) 97.5 F (36.4 C), temperature source Axillary, resp. rate 16, height 5\' 9"  (1.753 m), weight (!) 158.8 kg, SpO2 94 %.    Affect appropriate Obese male  HEENT: normal Neck supple with no adenopathy JVP normal no bruits no thyromegaly Lungs clear with no wheezing and good diaphragmatic motion Heart:  S1/S2 no murmur, no rub, gallop or click PMI normal Abdomen: benighn, BS positve, no tenderness, no AAA no bruit.  No HSM or HJR Distal pulses intact with no bruits No edema Neuro non-focal Skin warm and dry No muscular weakness   Labs:   Lab Results  Component Value Date   WBC 9.3 07/26/2021   HGB 14.1 07/26/2021   HCT 40.5 07/26/2021   MCV 88.2 07/26/2021   PLT 228 07/26/2021    Recent Labs  Lab 07/26/21 0342  NA 139  K 3.2*  CL 105  CO2 28  BUN 23*  CREATININE 1.23  CALCIUM 8.9  PROT 6.4*  BILITOT 0.3  ALKPHOS 68  ALT 31  AST 23  GLUCOSE 126*   No results found for: CKTOTAL, CKMB,  CKMBINDEX, TROPONINI  Lab Results  Component Value Date   CHOL 150 07/26/2021   Lab Results  Component Value Date   HDL 32 (L) 07/26/2021   Lab Results  Component Value Date   LDLCALC 98 07/26/2021   Lab Results  Component Value Date   TRIG 101  07/26/2021   Lab Results  Component Value Date   CHOLHDL 4.7 07/26/2021   No results found for: LDLDIRECT    Radiology: DG Chest 2 View  Result Date: 07/25/2021 CLINICAL DATA:  Chest pain. EXAM: CHEST - 2 VIEW COMPARISON:  Chest x-ray 11/26/2020 FINDINGS: Heart is borderline enlarged. Lungs and costophrenic angles are clear. No pneumothorax or acute fracture. IMPRESSION: No active cardiopulmonary disease. Electronically Signed   By: Darliss Cheney M.D.   On: 07/25/2021 22:10    EKG: SR inferior lateral T wave changes    ASSESSMENT AND PLAN:   Chest Pain:  with ECG changes essentially negative troponin Pain free this am CTA no dissection TTE to assess EF Shared decision making favor cath on Tuesday ( Monday is holiday)  no heparin for now Risks including stroke intubation bleeding and need for emergency surgery discussed willing to proceed No issues with contrast for dissection CTA yesterday Good right radial pulse HTN;  improved on multiple drugs see above OSA:  wearing nasal CPAP Thyroid:  on replacement TSH 6.4 consider increasing post cath  Smoking: nicotine patch  HLD:  LDL 84 not on statin start lipitor 40 mg daily and increase to 80 mg if needs intervention   Signed: Charlton Haws 07/26/2021, 9:01 AM

## 2021-07-27 DIAGNOSIS — E1165 Type 2 diabetes mellitus with hyperglycemia: Secondary | ICD-10-CM | POA: Diagnosis not present

## 2021-07-27 DIAGNOSIS — E039 Hypothyroidism, unspecified: Secondary | ICD-10-CM

## 2021-07-27 DIAGNOSIS — R079 Chest pain, unspecified: Secondary | ICD-10-CM | POA: Diagnosis not present

## 2021-07-27 DIAGNOSIS — I10 Essential (primary) hypertension: Secondary | ICD-10-CM | POA: Diagnosis not present

## 2021-07-27 DIAGNOSIS — E119 Type 2 diabetes mellitus without complications: Secondary | ICD-10-CM

## 2021-07-27 LAB — GLUCOSE, CAPILLARY
Glucose-Capillary: 132 mg/dL — ABNORMAL HIGH (ref 70–99)
Glucose-Capillary: 142 mg/dL — ABNORMAL HIGH (ref 70–99)
Glucose-Capillary: 180 mg/dL — ABNORMAL HIGH (ref 70–99)
Glucose-Capillary: 170 mg/dL — ABNORMAL HIGH (ref 70–99)

## 2021-07-27 LAB — HEMOGLOBIN A1C
Hgb A1c MFr Bld: 6.5 % — ABNORMAL HIGH (ref 4.8–5.6)
Mean Plasma Glucose: 139.85 mg/dL

## 2021-07-27 MED ORDER — INSULIN ASPART 100 UNIT/ML IJ SOLN
0.0000 [IU] | Freq: Three times a day (TID) | INTRAMUSCULAR | Status: DC
  Administered 2021-07-27: 2 [IU] via SUBCUTANEOUS
  Administered 2021-07-28: 3 [IU] via SUBCUTANEOUS
  Administered 2021-07-28: 2 [IU] via SUBCUTANEOUS
  Administered 2021-07-28 – 2021-07-29 (×2): 1 [IU] via SUBCUTANEOUS
  Administered 2021-07-30: 2 [IU] via SUBCUTANEOUS

## 2021-07-27 MED ORDER — INSULIN ASPART 100 UNIT/ML IJ SOLN: 0.0000 [IU] | Freq: Every day | INTRAMUSCULAR | Status: DC

## 2021-07-27 MED ORDER — FUROSEMIDE 10 MG/ML IJ SOLN
20.0000 mg | Freq: Once | INTRAMUSCULAR | Status: AC
  Administered 2021-07-27: 20 mg via INTRAVENOUS
  Filled 2021-07-27: qty 2

## 2021-07-27 MED ORDER — SODIUM CHLORIDE 0.9% FLUSH
3.0000 mL | Freq: Two times a day (BID) | INTRAVENOUS | Status: DC
Start: 2021-07-27 — End: 2021-07-30
  Administered 2021-07-27 – 2021-07-29 (×6): 3 mL via INTRAVENOUS

## 2021-07-27 NOTE — Assessment & Plan Note (Addendum)
--  mildly hyperglycemic, continue SSI --add SSI --home on oral agent (could not tolerate metformin) vs. insulin

## 2021-07-27 NOTE — Plan of Care (Signed)

## 2021-07-27 NOTE — Progress Notes (Signed)
Patient has home CPAP set up at bedside. NO assistance needed from RT at this time.

## 2021-07-27 NOTE — Progress Notes (Signed)
Primary Cardologist:  [facility]  Subjective:  Denies SSCP, palpitations or Dyspnea Wondering if he is going to start insulin   Objective:  Vitals:   07/26/21 2026 07/27/21 0030 07/27/21 0449 07/27/21 0527  BP: (!) 164/97 (!) 163/101 (!) 169/110 (!) 154/100  Pulse: (!) 58 (!) 53 (!) 51   Resp: 16 16 16    Temp: 98.3 F (36.8 C) 98.4 F (36.9 C) (!) 96.8 F (36 C)   TempSrc: Oral Axillary Axillary   SpO2: 99%     Weight:      Height:        Intake/Output from previous day:  Intake/Output Summary (Last 24 hours) at 07/27/2021 1016 Last data filed at 07/26/2021 2000 Gross per 24 hour  Intake 240 ml  Output --  Net 240 ml    Physical Exam: Affect appropriate Healthy:  appears stated age HEENT: normal Neck supple with no adenopathy JVP normal no bruits no thyromegaly Lungs clear with no wheezing and good diaphragmatic motion Heart:  S1/S2 no murmur, no rub, gallop or click PMI normal Abdomen: benighn, BS positve, no tenderness, no AAA no bruit.  No HSM or HJR Distal pulses intact with no bruits No edema Neuro non-focal Skin warm and dry No muscular weakness   Lab Results: Basic Metabolic Panel: Recent Labs    07/25/21 2135 07/25/21 2339 07/26/21 0342  NA 139  --  139  K 3.1*  --  3.2*  CL 102  --  105  CO2 27  --  28  GLUCOSE 143*  --  126*  BUN 25*  --  23*  CREATININE 1.12 1.20 1.23  CALCIUM 9.1  --  8.9  MG  --  2.2 2.0  PHOS  --  3.3 4.4   Liver Function Tests: Recent Labs    07/26/21 0342  AST 23  ALT 31  ALKPHOS 68  BILITOT 0.3  PROT 6.4*  ALBUMIN 3.6   No results for input(s): LIPASE, AMYLASE in the last 72 hours. CBC: Recent Labs    07/25/21 2339 07/26/21 0342  WBC 10.6* 9.3  HGB 15.2 14.1  HCT 43.8 40.5  MCV 89.2 88.2  PLT 248 228    Fasting Lipid Panel: Recent Labs    07/26/21 0342  CHOL 150  HDL 32*  LDLCALC 98  TRIG 07/28/21  CHOLHDL 4.7   Thyroid Function Tests: Recent Labs    07/26/21 0342   TSH 6.446*     Imaging: DG Chest 2 View  Result Date: 07/25/2021 CLINICAL DATA:  Chest pain. EXAM: CHEST - 2 VIEW COMPARISON:  Chest x-ray 11/26/2020 FINDINGS: Heart is borderline enlarged. Lungs and costophrenic angles are clear. No pneumothorax or acute fracture. IMPRESSION: No active cardiopulmonary disease. Electronically Signed   By: 11/28/2020 M.D.   On: 07/25/2021 22:10   ECHOCARDIOGRAM COMPLETE  Result Date: 07/26/2021    ECHOCARDIOGRAM REPORT   Patient Name:   Benjamin Small Date of Exam: 07/26/2021 Medical Rec #:  07/28/2021         Height:       69.0 in Accession #:    [id number]        Weight:       350.0 lb Date of Birth:  11/24/1970         BSA:          2.621 m Patient Age:    50 years          BP:  155/101 mmHg Patient Gender: M                 HR:           52 bpm. Exam Location:  Inpatient Procedure: 2D Echo, Cardiac Doppler, Color Doppler and Intracardiac            Opacification Agent Indications:    Chest Pain  History:        Patient has no prior history of Echocardiogram examinations.                 Signs/Symptoms:Chest Pain; Risk Factors:Hypertension and                 Dyslipidemia.  Sonographer:    Mikki Harbororothy Buchanan Referring Phys: [id number] Kieth BrightlyICHARD G MACNEIL  Sonographer Comments: Patient is morbidly obese. Image acquisition challenging due to patient body habitus. IMPRESSIONS  1. Left ventricular ejection fraction, by estimation, is 60 to 65%. The left ventricle has normal function. The left ventricle has no regional wall motion abnormalities. There is moderate left ventricular hypertrophy. Left ventricular diastolic parameters are consistent with Grade I diastolic dysfunction (impaired relaxation).  2. Right ventricular systolic function is normal. The right ventricular size is normal.  3. Left atrial size was mildly dilated.  4. The mitral valve is grossly normal. Trivial mitral valve regurgitation.  5. The aortic valve was not well visualized. Aortic valve  regurgitation is not visualized. No aortic stenosis is present.  6. Aortic dilatation noted. There is borderline dilatation of the ascending aorta, measuring 38 mm.  7. The inferior vena cava is dilated in size with >50% respiratory variability, suggesting right atrial pressure of 8 mmHg. Comparison(s): No prior Echocardiogram. FINDINGS  Left Ventricle: Left ventricular ejection fraction, by estimation, is 60 to 65%. The left ventricle has normal function. The left ventricle has no regional wall motion abnormalities. Definity contrast agent was given IV to delineate the left ventricular  endocardial borders. The left ventricular internal cavity size was normal in size. There is moderate left ventricular hypertrophy. Left ventricular diastolic parameters are consistent with Grade I diastolic dysfunction (impaired relaxation). Indeterminate filling pressures. Right Ventricle: The right ventricular size is normal. No increase in right ventricular wall thickness. Right ventricular systolic function is normal. Left Atrium: Left atrial size was mildly dilated. Right Atrium: Right atrial size was normal in size. Pericardium: There is no evidence of pericardial effusion. Mitral Valve: The mitral valve is grossly normal. Trivial mitral valve regurgitation. MV peak gradient, 5.9 mmHg. The mean mitral valve gradient is 1.0 mmHg. Tricuspid Valve: The tricuspid valve is grossly normal. Tricuspid valve regurgitation is trivial. Aortic Valve: The aortic valve was not well visualized. Aortic valve regurgitation is not visualized. No aortic stenosis is present. Aortic valve mean gradient measures 4.0 mmHg. Aortic valve peak gradient measures 9.0 mmHg. Aortic valve area, by VTI measures 3.38 cm. Pulmonic Valve: The pulmonic valve was grossly normal. Pulmonic valve regurgitation is trivial. Aorta: Aortic dilatation noted. There is borderline dilatation of the ascending aorta, measuring 38 mm. Venous: The inferior vena cava is dilated  in size with greater than 50% respiratory variability, suggesting right atrial pressure of 8 mmHg. IAS/Shunts: No atrial level shunt detected by color flow Doppler.  LEFT VENTRICLE PLAX 2D LVIDd:         6.30 cm   Diastology LVIDs:         4.30 cm   LV e' lateral:   5.93 cm/s LV PW:  1.70 cm   LV E/e' lateral: 15.0 LV IVS:        1.80 cm LVOT diam:     2.20 cm LV SV:         101 LV SV Index:   39 LVOT Area:     3.80 cm  RIGHT VENTRICLE RV Basal diam:  3.75 cm RV Mid diam:    3.10 cm LEFT ATRIUM             Index        RIGHT ATRIUM           Index LA diam:        5.00 cm 1.91 cm/m   RA Area:     23.80 cm LA Vol (A2C):   80.0 ml 30.53 ml/m  RA Volume:   77.00 ml  29.38 ml/m LA Vol (A4C):   95.9 ml 36.59 ml/m LA Biplane Vol: 87.1 ml 33.24 ml/m  AORTIC VALVE                    PULMONIC VALVE AV Area (Vmax):    2.91 cm     PV Vmax:       0.68 m/s AV Area (Vmean):   3.06 cm     PV Peak grad:  1.8 mmHg AV Area (VTI):     3.38 cm AV Vmax:           150.00 cm/s AV Vmean:          93.300 cm/s AV VTI:            0.300 m AV Peak Grad:      9.0 mmHg AV Mean Grad:      4.0 mmHg LVOT Vmax:         115.00 cm/s LVOT Vmean:        75.100 cm/s LVOT VTI:          0.267 m LVOT/AV VTI ratio: 0.89  AORTA Ao Root diam: 3.60 cm Ao Asc diam:  3.80 cm MITRAL VALVE MV Area (PHT): 2.94 cm    SHUNTS MV Area VTI:   2.69 cm    Systemic VTI:  0.27 m MV Peak grad:  5.9 mmHg    Systemic Diam: 2.20 cm MV Mean grad:  1.0 mmHg MV Vmax:       1.21 m/s MV Vmean:      48.8 cm/s MV Decel Time: 258 msec MV E velocity: 89.10 cm/s MV A velocity: 67.30 cm/s MV E/A ratio:  1.32 Zoila Shutter MD Electronically signed by Zoila Shutter MD Signature Date/Time: 07/26/2021/5:03:29 PM    Final     Cardiac Studies:  ECG: SR inferior T wave changes    Telemetry:  NSR 07/27/2021   Echo: No RWMA EF 60-65%   Medications:    amLODipine  10 mg Oral QPM   aspirin  324 mg Oral Once   aspirin EC  81 mg Oral Daily   atorvastatin  40 mg Oral Daily    carvedilol  25 mg Oral BID WC   enoxaparin (LOVENOX) injection  80 mg Subcutaneous Q24H   escitalopram  20 mg Oral Daily   furosemide  20 mg Oral QPM   levothyroxine  200 mcg Oral Q0600   lisinopril  40 mg Oral QPM   nicotine  21 mg Transdermal Daily   spironolactone  25 mg Oral QPM   tamsulosin  0.4 mg Oral QPM      Assessment/Plan:  ASSESSMENT AND PLAN:  Chest Pain:  with ECG changes essentially negative troponin Pain free  CTA no dissection TTE normal EF no RWMA;s  Shared decision making favor cath on Tuesday ( Monday is holiday)  no heparin for now Risks including stroke intubation bleeding and need for emergency surgery discussed willing to proceed No issues with contrast for dissection CTA yesterday Good right radial pulse HTN;  On 5 drugs now if remains high would consider adding hydralazine or bidil  OSA:  wearing nasal CPAP Thyroid:  on replacement TSH 6.4 consider increasing post cath  Smoking: nicotine patch  HLD:  LDL 84 not on statin start lipitor 40 mg daily and increase to 80 mg if needs intervention   Charlton Haws 07/27/2021, 10:16 AM

## 2021-07-27 NOTE — Progress Notes (Addendum)
  Progress Note   Patient: Benjamin Small QAS:[id number] DOB: 08/23/1970 DOA: 07/25/2021     1 DOS: the patient was seen and examined on 07/27/2021   Brief hospital course: 50yom PMH OSA, ADD, HTN presented with retrosternal chest pain, diaphoresis and shortness of breath in the context of significantly elevated systolic blood pressure.  Had a CTA of the chest abdomen pelvis with no aortic dissection.  Left AMA from presenting facility for.  Continued to have substernal chest pain, seen by cardiology and sent to the emergency department, admitted for further evaluation of chest pain.  Cardiology plans cardiac catheterization, delayed by holiday, with plan for 5/30.  Assessment and Plan: * Chest pain --Echocardiogram showed LVEF 60-65%, no WMA, catheterization planned Tuesday by cardiology, no heparin for now per cardiology. Troponins flat in 30s. Continue antihypertensives, aspirin, statin.  DM type 2 (diabetes mellitus, type 2) (HCC) --hyperglycemic --add SSI --home on oral agent (could not tolerate metformin)  Hypothyroidism --TSH 6.446. follow-up as an outpatient. Continue levothyroxine   Essential hypertension --remains elevated; will continue carvedilol, Lasix, lisinopril, spironolactone, amlodipine; may need additional agent, will defer to cardiology  Hypokalemia-resolved as of 07/27/2021 --repleted. Mg 2+ WNL.  Dyslipidemia --continue statin  Tobacco abuse --recommend cessation  OSA (obstructive sleep apnea) --continue CPAP        Subjective:  Feels ok Would like to shower C/o swelling in arms and legs No chest pain Breathing ok  Physical Exam: Vitals:   07/27/21 0030 07/27/21 0449 07/27/21 0527 07/27/21 1518  BP: (!) 163/101 (!) 169/110 (!) 154/100 (!) 183/109  Pulse: (!) 53 (!) 51  69  Resp: 16 16  18   Temp: 98.4 F (36.9 C) (!) 96.8 F (36 C)  98.3 F (36.8 C)  TempSrc: Axillary Axillary  Oral  SpO2:    97%  Weight:      Height:       Physical  Exam Vitals reviewed.  Constitutional:      General: He is not in acute distress.    Appearance: He is not ill-appearing or toxic-appearing.  Cardiovascular:     Rate and Rhythm: Normal rate and regular rhythm.     Heart sounds: No murmur heard. Pulmonary:     Effort: Pulmonary effort is normal. No respiratory distress.     Breath sounds: No wheezing, rhonchi or rales.  Neurological:     Mental Status: He is alert.  Psychiatric:        Mood and Affect: Mood normal.        Behavior: Behavior normal.    Data Reviewed:  CBG stable  Family Communication: none  Disposition: Status is: Inpatient Remains inpatient appropriate because: chest pain, awaits [facility]  Planned Discharge Destination: Home    Time spent: 20 minutes  Author: , MD 07/27/2021 4:04 PM  For on call review [email or web address]/[email or web address].

## 2021-07-28 DIAGNOSIS — E039 Hypothyroidism, unspecified: Secondary | ICD-10-CM | POA: Diagnosis not present

## 2021-07-28 DIAGNOSIS — Z6841 Body Mass Index (BMI) 40.0 and over, adult: Secondary | ICD-10-CM

## 2021-07-28 DIAGNOSIS — R072 Precordial pain: Secondary | ICD-10-CM

## 2021-07-28 DIAGNOSIS — E1165 Type 2 diabetes mellitus with hyperglycemia: Secondary | ICD-10-CM | POA: Diagnosis not present

## 2021-07-28 DIAGNOSIS — I10 Essential (primary) hypertension: Secondary | ICD-10-CM | POA: Diagnosis not present

## 2021-07-28 LAB — GLUCOSE, CAPILLARY
Glucose-Capillary: 148 mg/dL — ABNORMAL HIGH (ref 70–99)
Glucose-Capillary: 210 mg/dL — ABNORMAL HIGH (ref 70–99)
Glucose-Capillary: 152 mg/dL — ABNORMAL HIGH (ref 70–99)
Glucose-Capillary: 147 mg/dL — ABNORMAL HIGH (ref 70–99)

## 2021-07-28 MED ORDER — SODIUM CHLORIDE 0.9 % WEIGHT BASED INFUSION
3.0000 mL/kg/h | INTRAVENOUS | Status: DC
  Administered 2021-07-29: 3 mL/kg/h via INTRAVENOUS

## 2021-07-28 MED ORDER — SODIUM CHLORIDE 0.9 % IV SOLN: 250.0000 mL | INTRAVENOUS | Status: DC | PRN

## 2021-07-28 MED ORDER — HYDRALAZINE HCL 25 MG PO TABS
25.0000 mg | ORAL_TABLET | Freq: Three times a day (TID) | ORAL | Status: DC
  Administered 2021-07-28 – 2021-07-29 (×3): 25 mg via ORAL
  Filled 2021-07-28 (×3): qty 1

## 2021-07-28 MED ORDER — SODIUM CHLORIDE 0.9% FLUSH: 3.0000 mL | INTRAVENOUS | Status: DC | PRN

## 2021-07-28 MED ORDER — SODIUM CHLORIDE 0.9 % WEIGHT BASED INFUSION
1.0000 mL/kg/h | INTRAVENOUS | Status: DC
  Administered 2021-07-29: 1 mL/kg/h via INTRAVENOUS

## 2021-07-28 MED ORDER — ASPIRIN 81 MG PO CHEW
81.0000 mg | CHEWABLE_TABLET | ORAL | Status: AC
  Administered 2021-07-29: 81 mg via ORAL
  Filled 2021-07-28: qty 1

## 2021-07-28 NOTE — Assessment & Plan Note (Signed)
--  Body mass index is 51.69 kg/m. --dietician consult --weight loss recommended

## 2021-07-28 NOTE — Progress Notes (Addendum)
Progress Note  Patient Name: Benjamin Small Date of Encounter: 07/28/2021  Primary Cardiologist: None   Subjective   Patient seen and examined at his bedside.  Inpatient Medications    Scheduled Meds:  amLODipine  10 mg Oral QPM   aspirin  324 mg Oral Once   aspirin EC  81 mg Oral Daily   atorvastatin  40 mg Oral Daily   carvedilol  25 mg Oral BID WC   enoxaparin (LOVENOX) injection  80 mg Subcutaneous Q24H   escitalopram  20 mg Oral Daily   insulin aspart  0-5 Units Subcutaneous QHS   insulin aspart  0-9 Units Subcutaneous TID WC   levothyroxine  200 mcg Oral Q0600   lisinopril  40 mg Oral QPM   nicotine  21 mg Transdermal Daily   sodium chloride flush  3 mL Intravenous Q12H   spironolactone  25 mg Oral QPM   tamsulosin  0.4 mg Oral QPM   Continuous Infusions:  PRN Meds: acetaminophen **OR** acetaminophen, albuterol, ALPRAZolam, hydrALAZINE, ondansetron **OR** ondansetron (ZOFRAN) IV   Vital Signs    Vitals:   07/27/21 0527 07/27/21 1518 07/27/21 2037 07/28/21 0618  BP: (!) 154/100 (!) 183/109 (!) 175/101 (!) 169/99  Pulse:  69 65 67  Resp:  18 16 18   Temp:  98.3 F (36.8 C) 98.2 F (36.8 C) 97.8 F (36.6 C)  TempSrc:  Oral Oral Oral  SpO2:  97% 96% 97%  Weight:      Height:        Intake/Output Summary (Last 24 hours) at 07/28/2021 0851 Last data filed at 07/28/2021 0500 Gross per 24 hour  Intake 120 ml  Output --  Net 120 ml   Filed Weights   07/25/21 2118  Weight: (!) 158.8 kg    Telemetry    Sinus rhythm - Personally Reviewed  ECG    None  - Personally Reviewed  Physical Exam    General: Comfortable, Head: Atraumatic, normal size  Eyes: PEERLA, EOMI  Neck: Supple, normal JVD Cardiac: Normal S1, S2; RRR; no murmurs, rubs, or gallops Lungs: Clear to auscultation bilaterally Abd: Soft, nontender, no hepatomegaly  Ext: warm, no edema Musculoskeletal: No deformities, BUE and BLE strength normal and equal Skin: Warm and dry, no  rashes   Neuro: Alert and oriented to person, place, time, and situation, CNII-XII grossly intact, no focal deficits  Psych: Normal mood and affect   Labs    Chemistry Recent Labs  Lab 07/25/21 2135 07/25/21 2339 07/26/21 0342  NA 139  --  139  K 3.1*  --  3.2*  CL 102  --  105  CO2 27  --  28  GLUCOSE 143*  --  126*  BUN 25*  --  23*  CREATININE 1.12 1.20 1.23  CALCIUM 9.1  --  8.9  PROT  --   --  6.4*  ALBUMIN  --   --  3.6  AST  --   --  23  ALT  --   --  31  ALKPHOS  --   --  68  BILITOT  --   --  0.3  GFRNONAA >60 >60 >60  ANIONGAP 10  --  6     Hematology Recent Labs  Lab 07/25/21 2135 07/25/21 2339 07/26/21 0342  WBC 10.8* 10.6* 9.3  RBC 4.92 4.91 4.59  HGB 15.0 15.2 14.1  HCT 44.6 43.8 40.5  MCV 90.7 89.2 88.2  MCH 30.5 31.0 30.7  MCHC 33.6  34.7 34.8  RDW 13.5 13.5 13.6  PLT 244 248 228    Cardiac EnzymesNo results for input(s): TROPONINI in the last 168 hours. No results for input(s): TROPIPOC in the last 168 hours.   BNPNo results for input(s): BNP, PROBNP in the last 168 hours.   DDimer No results for input(s): DDIMER in the last 168 hours.   Radiology    ECHOCARDIOGRAM COMPLETE  Result Date: 07/26/2021    ECHOCARDIOGRAM REPORT   Patient Name:   Lowella FairyCHRISTOPHER Gorter Date of Exam: 07/26/2021 Medical Rec #:  [id number]         Height:       69.0 in Accession #:    [id number]        Weight:       350.0 lb Date of Birth:  11-Dec-1970         BSA:          2.621 m Patient Age:    50 years          BP:           155/101 mmHg Patient Gender: M                 HR:           52 bpm. Exam Location:  Inpatient Procedure: 2D Echo, Cardiac Doppler, Color Doppler and Intracardiac            Opacification Agent Indications:    Chest Pain  History:        Patient has no prior history of Echocardiogram examinations.                 Signs/Symptoms:Chest Pain; Risk Factors:Hypertension and                 Dyslipidemia.  Sonographer:    Mikki Harbororothy Buchanan Referring Phys:  [id number] Kieth BrightlyICHARD G MACNEIL  Sonographer Comments: Patient is morbidly obese. Image acquisition challenging due to patient body habitus. IMPRESSIONS  1. Left ventricular ejection fraction, by estimation, is 60 to 65%. The left ventricle has normal function. The left ventricle has no regional wall motion abnormalities. There is moderate left ventricular hypertrophy. Left ventricular diastolic parameters are consistent with Grade I diastolic dysfunction (impaired relaxation).  2. Right ventricular systolic function is normal. The right ventricular size is normal.  3. Left atrial size was mildly dilated.  4. The mitral valve is grossly normal. Trivial mitral valve regurgitation.  5. The aortic valve was not well visualized. Aortic valve regurgitation is not visualized. No aortic stenosis is present.  6. Aortic dilatation noted. There is borderline dilatation of the ascending aorta, measuring 38 mm.  7. The inferior vena cava is dilated in size with >50% respiratory variability, suggesting right atrial pressure of 8 mmHg. Comparison(s): No prior Echocardiogram. FINDINGS  Left Ventricle: Left ventricular ejection fraction, by estimation, is 60 to 65%. The left ventricle has normal function. The left ventricle has no regional wall motion abnormalities. Definity contrast agent was given IV to delineate the left ventricular  endocardial borders. The left ventricular internal cavity size was normal in size. There is moderate left ventricular hypertrophy. Left ventricular diastolic parameters are consistent with Grade I diastolic dysfunction (impaired relaxation). Indeterminate filling pressures. Right Ventricle: The right ventricular size is normal. No increase in right ventricular wall thickness. Right ventricular systolic function is normal. Left Atrium: Left atrial size was mildly dilated. Right Atrium: Right atrial size was normal in size. Pericardium: There is no evidence of pericardial effusion.  Mitral Valve: The mitral  valve is grossly normal. Trivial mitral valve regurgitation. MV peak gradient, 5.9 mmHg. The mean mitral valve gradient is 1.0 mmHg. Tricuspid Valve: The tricuspid valve is grossly normal. Tricuspid valve regurgitation is trivial. Aortic Valve: The aortic valve was not well visualized. Aortic valve regurgitation is not visualized. No aortic stenosis is present. Aortic valve mean gradient measures 4.0 mmHg. Aortic valve peak gradient measures 9.0 mmHg. Aortic valve area, by VTI measures 3.38 cm. Pulmonic Valve: The pulmonic valve was grossly normal. Pulmonic valve regurgitation is trivial. Aorta: Aortic dilatation noted. There is borderline dilatation of the ascending aorta, measuring 38 mm. Venous: The inferior vena cava is dilated in size with greater than 50% respiratory variability, suggesting right atrial pressure of 8 mmHg. IAS/Shunts: No atrial level shunt detected by color flow Doppler.  LEFT VENTRICLE PLAX 2D LVIDd:         6.30 cm   Diastology LVIDs:         4.30 cm   LV e' lateral:   5.93 cm/s LV PW:         1.70 cm   LV E/e' lateral: 15.0 LV IVS:        1.80 cm LVOT diam:     2.20 cm LV SV:         101 LV SV Index:   39 LVOT Area:     3.80 cm  RIGHT VENTRICLE RV Basal diam:  3.75 cm RV Mid diam:    3.10 cm LEFT ATRIUM             Index        RIGHT ATRIUM           Index LA diam:        5.00 cm 1.91 cm/m   RA Area:     23.80 cm LA Vol (A2C):   80.0 ml 30.53 ml/m  RA Volume:   77.00 ml  29.38 ml/m LA Vol (A4C):   95.9 ml 36.59 ml/m LA Biplane Vol: 87.1 ml 33.24 ml/m  AORTIC VALVE                    PULMONIC VALVE AV Area (Vmax):    2.91 cm     PV Vmax:       0.68 m/s AV Area (Vmean):   3.06 cm     PV Peak grad:  1.8 mmHg AV Area (VTI):     3.38 cm AV Vmax:           150.00 cm/s AV Vmean:          93.300 cm/s AV VTI:            0.300 m AV Peak Grad:      9.0 mmHg AV Mean Grad:      4.0 mmHg LVOT Vmax:         115.00 cm/s LVOT Vmean:        75.100 cm/s LVOT VTI:          0.267 m LVOT/AV VTI ratio:  0.89  AORTA Ao Root diam: 3.60 cm Ao Asc diam:  3.80 cm MITRAL VALVE MV Area (PHT): 2.94 cm    SHUNTS MV Area VTI:   2.69 cm    Systemic VTI:  0.27 m MV Peak grad:  5.9 mmHg    Systemic Diam: 2.20 cm MV Mean grad:  1.0 mmHg MV Vmax:       1.21 m/s MV Vmean:  48.8 cm/s MV Decel Time: 258 msec MV E velocity: 89.10 cm/s MV A velocity: 67.30 cm/s MV E/A ratio:  1.32 Zoila Shutter MD Electronically signed by Zoila Shutter MD Signature Date/Time: 07/26/2021/5:03:29 PM    Final     Cardiac Studies   TTE 07/26/2021 IMPRESSIONS   1. Left ventricular ejection fraction, by estimation, is 60 to 65%. The left ventricle has normal function. The left ventricle has no regional wall motion abnormalities. There is moderate left ventricular hypertrophy.  Left ventricular diastolic parameters are consistent with Grade I diastolic dysfunction (impaired  relaxation).   2. Right ventricular systolic function is normal. The right ventricular size is normal.   3. Left atrial size was mildly dilated.   4. The mitral valve is grossly normal. Trivial mitral valve  regurgitation.   5. The aortic valve was not well visualized. Aortic valve regurgitation  is not visualized. No aortic stenosis is present.   6. Aortic dilatation noted. There is borderline dilatation of the  ascending aorta, measuring 38 mm.   7. The inferior vena cava is dilated in size with >50% respiratory  variability, suggesting right atrial pressure of 8 mmHg.   Comparison(s): No prior Echocardiogram.   FINDINGS   Left Ventricle: Left ventricular ejection fraction, by estimation, is 60  to 65%. The left ventricle has normal function. The left ventricle has no  regional wall motion abnormalities. Definity contrast agent was given IV  to delineate the left ventricular   endocardial borders. The left ventricular internal cavity size was normal  in size. There is moderate left ventricular hypertrophy. Left ventricular  diastolic parameters are  consistent with Grade I diastolic dysfunction  (impaired relaxation).  Indeterminate filling pressures.   Right Ventricle: The right ventricular size is normal. No increase in  right ventricular wall thickness. Right ventricular systolic function is  normal.   Left Atrium: Left atrial size was mildly dilated.   Right Atrium: Right atrial size was normal in size.   Pericardium: There is no evidence of pericardial effusion.   Mitral Valve: The mitral valve is grossly normal. Trivial mitral valve  regurgitation. MV peak gradient, 5.9 mmHg. The mean mitral valve gradient  is 1.0 mmHg.   Tricuspid Valve: The tricuspid valve is grossly normal. Tricuspid valve  regurgitation is trivial.   Aortic Valve: The aortic valve was not well visualized. Aortic valve  regurgitation is not visualized. No aortic stenosis is present. Aortic  valve mean gradient measures 4.0 mmHg. Aortic valve peak gradient measures  9.0 mmHg. Aortic valve area, by VTI  measures 3.38 cm.   Pulmonic Valve: The pulmonic valve was grossly normal. Pulmonic valve  regurgitation is trivial.   Aorta: Aortic dilatation noted. There is borderline dilatation of the  ascending aorta, measuring 38 mm.   Venous: The inferior vena cava is dilated in size with greater than 50%  respiratory variability, suggesting right atrial pressure of 8 mmHg.   IAS/Shunts: No atrial level shunt detected by color flow Doppler.   Patient Profile     51 y.o. male with history of hypertension, OSA and smoking  Assessment & Plan    Chest pain-current work-up shows no troponin elevation, CTA with no dissection, previous plan discussed for left heart catheterization tomorrow.  Shared Decision Making/Informed Consent The risks [stroke (1 in 1000), death (1 in 1000), kidney failure [usually temporary] (1 in 500), bleeding (1 in 200), allergic reaction [possibly serious] (1 in 200)], benefits (diagnostic support and management of coronary artery  disease)  and alternatives of a cardiac catheterization were discussed in detail with Mr. Krontz and he is willing to proceed.   Hypertension-blood pressure still elevated on amlodipine 10 mg daily, carvedilol 25 mg twice daily, lisinopril 40 mg daily and spironolactone 25 mg daily.  We will add hydralazine 25 mg 3 times daily.  OSA-wears CPAP  Hypothyroidism-on thyroid replacement therapy.  Plan to increase this post heart catheterization  Smoking cessation advised.  Hyperlipidemia currently on Lipitor 40 mg today.  If significant coronary artery disease suggest increasing to 84 LDL goal less than 70.  For questions or updates, please contact [facility] Please consult [email or web address] for contact info under Cardiology/STEMI.      Signed, Thomasene Ripple, DO  07/28/2021, 8:51 AM

## 2021-07-28 NOTE — Progress Notes (Signed)
Cardiac catheterization was discussed with the patient fully. The patient understands that risks include but are not limited to stroke (1 in 1000), death (1 in 1000), kidney failure [usually temporary] (1 in 500), bleeding (1 in 200), allergic reaction [possibly serious] (1 in 200).  The patient understands and is willing to proceed.    Pt on board, orders written  Theodore Demark, PA-C 07/28/2021 5:35 PM

## 2021-07-28 NOTE — Progress Notes (Signed)
  Progress Note   Patient: Benjamin Small TMY:[id number] DOB: 08/26/1970 DOA: 07/25/2021     2 DOS: the patient was seen and examined on 07/28/2021   Brief hospital course: 50yom PMH OSA, ADD, HTN presented with retrosternal chest pain, diaphoresis and shortness of breath in the context of significantly elevated systolic blood pressure.  Had a CTA of the chest abdomen pelvis with no aortic dissection.  Left AMA from presenting facility for.  Continued to have substernal chest pain, seen by cardiology and sent to the emergency department, admitted for further evaluation of chest pain.  Cardiology plans cardiac catheterization, delayed by holiday, with plan for 5/30.  Assessment and Plan: * Chest pain --Echocardiogram showed LVEF 60-65%, no WMA, catheterization planned Tuesday by cardiology, no heparin for now per cardiology. Troponins flat in 30s. Continue antihypertensives, aspirin, statin.  DM type 2 (diabetes mellitus, type 2) (HCC) --mildly hyperglycemic, continue SSI --add SSI --home on oral agent (could not tolerate metformin) vs. insulin  Hypothyroidism --TSH 6.446. follow-up as an outpatient. Continue levothyroxine   Essential hypertension --remains elevated; continue carvedilol, Lasix, lisinopril, spironolactone, amlodipine; hydralazine added by cardiology  Hypokalemia-resolved as of 07/27/2021 --repleted. Mg 2+ WNL.  Morbid obesity with BMI of 50.0-59.9, adult (HCC) --Body mass index is 51.69 kg/m. --dietician consult --weight loss recommended  Dyslipidemia --continue statin  Tobacco abuse --recommend cessation  OSA (obstructive sleep apnea) --continue CPAP        Subjective:  Feels fine  No CP  Physical Exam: Vitals:   07/28/21 0618 07/28/21 1424 07/28/21 1500 07/28/21 1702  BP: (!) 169/99 (!) 167/102  (!) 186/103  Pulse: 67  61   Resp: 18  18   Temp: 97.8 F (36.6 C)  97.6 F (36.4 C)   TempSrc: Oral  Axillary   SpO2: 97%  99%   Weight:       Height:       Physical Exam Vitals reviewed.  Constitutional:      General: He is not in acute distress.    Appearance: He is not ill-appearing or toxic-appearing.  Cardiovascular:     Rate and Rhythm: Normal rate and regular rhythm.     Heart sounds: No murmur heard.    Comments: Telemetry SR Pulmonary:     Effort: Pulmonary effort is normal.     Breath sounds: No wheezing, rhonchi or rales.  Neurological:     Mental Status: He is alert.  Psychiatric:        Mood and Affect: Mood normal.        Behavior: Behavior normal.    Data Reviewed:  CBG stable  Family Communication: wife at bedside  Disposition: Status is: Inpatient Remains inpatient appropriate because: CP, LHC planned  Planned Discharge Destination: Home    Time spent: 25 minutes  Author: Brendia Sacks, MD 07/28/2021 5:58 PM  For on call review [email or web address].

## 2021-07-29 ENCOUNTER — Other Ambulatory Visit (HOSPITAL_COMMUNITY): Payer: Self-pay

## 2021-07-29 ENCOUNTER — Encounter (HOSPITAL_COMMUNITY)
Admission: EM | Disposition: A | Discharge status: Home / Self Care | Payer: Self-pay | Source: Home / Self Care | Attending: Family Medicine

## 2021-07-29 DIAGNOSIS — I249 Acute ischemic heart disease, unspecified: Secondary | ICD-10-CM | POA: Diagnosis not present

## 2021-07-29 DIAGNOSIS — E1165 Type 2 diabetes mellitus with hyperglycemia: Secondary | ICD-10-CM | POA: Diagnosis not present

## 2021-07-29 DIAGNOSIS — R072 Precordial pain: Secondary | ICD-10-CM | POA: Diagnosis not present

## 2021-07-29 DIAGNOSIS — I5033 Acute on chronic diastolic (congestive) heart failure: Secondary | ICD-10-CM | POA: Diagnosis not present

## 2021-07-29 DIAGNOSIS — E039 Hypothyroidism, unspecified: Secondary | ICD-10-CM | POA: Diagnosis not present

## 2021-07-29 HISTORY — PX: LEFT HEART CATH AND CORONARY ANGIOGRAPHY: CATH118249

## 2021-07-29 LAB — CBC
HCT: 41.4 % (ref 39.0–52.0)
Hemoglobin: 14.5 g/dL (ref 13.0–17.0)
MCH: 31.1 pg (ref 26.0–34.0)
MCHC: 35 g/dL (ref 30.0–36.0)
MCV: 88.8 fL (ref 80.0–100.0)
Platelets: 229 10*3/uL (ref 150–400)
RBC: 4.66 MIL/uL (ref 4.22–5.81)
RDW: 13.2 % (ref 11.5–15.5)
WBC: 9.1 10*3/uL (ref 4.0–10.5)
nRBC: 0 % (ref 0.0–0.2)

## 2021-07-29 LAB — BASIC METABOLIC PANEL
Anion gap: 9 (ref 5–15)
BUN: 15 mg/dL (ref 6–20)
CO2: 26 mmol/L (ref 22–32)
Calcium: 8.5 mg/dL — ABNORMAL LOW (ref 8.9–10.3)
Chloride: 103 mmol/L (ref 98–111)
Creatinine, Ser: 1.16 mg/dL (ref 0.61–1.24)
GFR, Estimated: >60 mL/min (ref >60)
Glucose, Bld: 118 mg/dL — ABNORMAL HIGH (ref 70–99)
Potassium: 3.7 mmol/L (ref 3.5–5.1)
Sodium: 138 mmol/L (ref 135–145)

## 2021-07-29 LAB — GLUCOSE, CAPILLARY
Glucose-Capillary: 117 mg/dL — ABNORMAL HIGH (ref 70–99)
Glucose-Capillary: 83 mg/dL (ref 70–99)
Glucose-Capillary: 146 mg/dL — ABNORMAL HIGH (ref 70–99)
Glucose-Capillary: 164 mg/dL — ABNORMAL HIGH (ref 70–99)

## 2021-07-29 SURGERY — LEFT HEART CATH AND CORONARY ANGIOGRAPHY
Anesthesia: LOCAL

## 2021-07-29 MED ORDER — HEPARIN (PORCINE) IN NACL 1000-0.9 UT/500ML-% IV SOLN
INTRAVENOUS | Status: AC
  Filled 2021-07-29: qty 1000

## 2021-07-29 MED ORDER — SODIUM CHLORIDE 0.9 % IV SOLN: 250.0000 mL | INTRAVENOUS | Status: DC | PRN

## 2021-07-29 MED ORDER — LOSARTAN POTASSIUM 50 MG PO TABS
50.0000 mg | ORAL_TABLET | Freq: Every day | ORAL | Status: DC
  Administered 2021-07-29: 50 mg via ORAL
  Filled 2021-07-29: qty 1

## 2021-07-29 MED ORDER — HYDRALAZINE HCL 50 MG PO TABS
50.0000 mg | ORAL_TABLET | Freq: Three times a day (TID) | ORAL | Status: DC
  Administered 2021-07-29: 50 mg via ORAL
  Filled 2021-07-29: qty 1

## 2021-07-29 MED ORDER — SODIUM CHLORIDE 0.9 % IV SOLN: INTRAVENOUS | Status: AC

## 2021-07-29 MED ORDER — LIDOCAINE HCL (PF) 1 % IJ SOLN
INTRAMUSCULAR | Status: DC | PRN
  Administered 2021-07-29: 2 mL

## 2021-07-29 MED ORDER — HEPARIN SODIUM (PORCINE) 1000 UNIT/ML IJ SOLN
INTRAMUSCULAR | Status: DC | PRN
  Administered 2021-07-29: 7500 [IU] via INTRAVENOUS

## 2021-07-29 MED ORDER — HEPARIN (PORCINE) IN NACL 1000-0.9 UT/500ML-% IV SOLN
INTRAVENOUS | Status: DC | PRN
  Administered 2021-07-29 (×2): 500 mL

## 2021-07-29 MED ORDER — VERAPAMIL HCL 2.5 MG/ML IV SOLN
INTRAVENOUS | Status: DC | PRN
  Administered 2021-07-29: 10 mL via INTRA_ARTERIAL

## 2021-07-29 MED ORDER — MIDAZOLAM HCL 2 MG/2ML IJ SOLN
INTRAMUSCULAR | Status: AC
  Filled 2021-07-29: qty 2

## 2021-07-29 MED ORDER — ACETAMINOPHEN 325 MG PO TABS: 650.0000 mg | ORAL_TABLET | ORAL | Status: DC | PRN

## 2021-07-29 MED ORDER — EMPAGLIFLOZIN 10 MG PO TABS
10.0000 mg | ORAL_TABLET | Freq: Every day | ORAL | Status: DC
Start: 2021-07-29 — End: 2021-07-30
  Administered 2021-07-29 – 2021-07-30 (×2): 10 mg via ORAL
  Filled 2021-07-29 (×2): qty 1

## 2021-07-29 MED ORDER — SODIUM CHLORIDE 0.9% FLUSH: 3.0000 mL | INTRAVENOUS | Status: DC | PRN

## 2021-07-29 MED ORDER — MIDAZOLAM HCL 2 MG/2ML IJ SOLN
INTRAMUSCULAR | Status: DC | PRN
  Administered 2021-07-29 (×2): 1 mg via INTRAVENOUS

## 2021-07-29 MED ORDER — LIDOCAINE HCL (PF) 1 % IJ SOLN
INTRAMUSCULAR | Status: AC
  Filled 2021-07-29: qty 30

## 2021-07-29 MED ORDER — HEPARIN SODIUM (PORCINE) 1000 UNIT/ML IJ SOLN
INTRAMUSCULAR | Status: AC
  Filled 2021-07-29: qty 10

## 2021-07-29 MED ORDER — LABETALOL HCL 5 MG/ML IV SOLN: 10.0000 mg | INTRAVENOUS | Status: AC | PRN

## 2021-07-29 MED ORDER — SODIUM CHLORIDE 0.9% FLUSH
3.0000 mL | Freq: Two times a day (BID) | INTRAVENOUS | Status: DC
Start: 2021-07-29 — End: 2021-07-30

## 2021-07-29 MED ORDER — LOSARTAN POTASSIUM 25 MG PO TABS: 25.0000 mg | ORAL_TABLET | Freq: Every day | ORAL | Status: DC

## 2021-07-29 MED ORDER — ENOXAPARIN SODIUM 40 MG/0.4ML IJ SOSY
40.0000 mg | PREFILLED_SYRINGE | INTRAMUSCULAR | Status: DC
  Administered 2021-07-30: 40 mg via SUBCUTANEOUS
  Filled 2021-07-29: qty 0.4

## 2021-07-29 MED ORDER — HYDRALAZINE HCL 20 MG/ML IJ SOLN: 10.0000 mg | INTRAMUSCULAR | Status: AC | PRN

## 2021-07-29 MED ORDER — ASPIRIN 81 MG PO CHEW
81.0000 mg | CHEWABLE_TABLET | Freq: Every day | ORAL | Status: DC
Start: 2021-07-29 — End: 2021-07-29

## 2021-07-29 MED ORDER — ONDANSETRON HCL 4 MG/2ML IJ SOLN: 4.0000 mg | Freq: Four times a day (QID) | INTRAMUSCULAR | Status: DC | PRN

## 2021-07-29 MED ORDER — VERAPAMIL HCL 2.5 MG/ML IV SOLN
INTRAVENOUS | Status: AC
  Filled 2021-07-29: qty 2

## 2021-07-29 MED ORDER — ATORVASTATIN CALCIUM 80 MG PO TABS: 80.0000 mg | ORAL_TABLET | Freq: Every day | ORAL | Status: DC

## 2021-07-29 MED ORDER — FENTANYL CITRATE (PF) 100 MCG/2ML IJ SOLN
INTRAMUSCULAR | Status: AC
  Filled 2021-07-29: qty 2

## 2021-07-29 MED ORDER — IOHEXOL 350 MG/ML SOLN
INTRAVENOUS | Status: DC | PRN
  Administered 2021-07-29: 75 mL

## 2021-07-29 MED ORDER — FUROSEMIDE 10 MG/ML IJ SOLN
20.0000 mg | Freq: Every day | INTRAMUSCULAR | Status: DC
Start: 2021-07-29 — End: 2021-07-29
  Administered 2021-07-29: 20 mg via INTRAVENOUS
  Filled 2021-07-29: qty 2

## 2021-07-29 MED ORDER — FUROSEMIDE 10 MG/ML IJ SOLN
20.0000 mg | Freq: Once | INTRAMUSCULAR | Status: AC
  Administered 2021-07-29: 20 mg via INTRAVENOUS
  Filled 2021-07-29: qty 2

## 2021-07-29 MED ORDER — FENTANYL CITRATE (PF) 100 MCG/2ML IJ SOLN
INTRAMUSCULAR | Status: DC | PRN
  Administered 2021-07-29: 25 ug via INTRAVENOUS

## 2021-07-29 MED ORDER — FUROSEMIDE 10 MG/ML IJ SOLN
40.0000 mg | Freq: Two times a day (BID) | INTRAMUSCULAR | Status: DC
  Administered 2021-07-30: 40 mg via INTRAVENOUS
  Filled 2021-07-29: qty 4

## 2021-07-29 SURGICAL SUPPLY — 10 items
BAND ZEPHYR COMPRESS 30 LONG (HEMOSTASIS) ×1 IMPLANT
CATH 5FR JL3.5 JR4 ANG PIG MP (CATHETERS) ×1 IMPLANT
GLIDESHEATH SLEND A-KIT 6F 22G (SHEATH) ×1 IMPLANT
GUIDEWIRE INQWIRE 1.5J.035X260 (WIRE) IMPLANT
INQWIRE 1.5J .035X260CM (WIRE) ×2
KIT HEART LEFT (KITS) ×2 IMPLANT
PACK CARDIAC CATHETERIZATION (CUSTOM PROCEDURE TRAY) ×2 IMPLANT
SHEATH PROBE COVER 6X72 (BAG) ×1 IMPLANT
TRANSDUCER W/STOPCOCK (MISCELLANEOUS) ×2 IMPLANT
TUBING CIL FLEX 10 FLL-RA (TUBING) ×2 IMPLANT

## 2021-07-29 NOTE — CV Procedure (Signed)
Normal coronary arteries, right dominant. Acute on chronic diastolic heart failure with LV EDP 37 mmHg. Given LVEF greater than 55% findings are consistent with acute on chronic diastolic heart failure.  Recommend SGLT2, and potentially replacing ARB with Entresto.  Agree with MRA for better blood pressure control.

## 2021-07-29 NOTE — Progress Notes (Addendum)
Inpatient Diabetes Program Recommendations  AACE/ADA: New Consensus Statement on Inpatient Glycemic Control (2015)  Target Ranges:  Prepandial:   less than 140 mg/dL      Peak postprandial:   less than 180 mg/dL (1-2 hours)      Critically ill patients:  140 - 180 mg/dL   Lab Results  Component Value Date   GLUCAP 83 07/29/2021   HGBA1C 6.5 (H) 07/26/2021    Review of Glycemic Control  Diabetes history:  DM 2 Outpatient Diabetes medications: metformin in the past with side effects Current orders for Inpatient glycemic control:  Novolog 0-9 units tid + hs  A1c 6.5% on 5/27    Pt desires to loose weight in addition to being concerned about glucose levels outpatient.  Pt would be a good candidate for Ozempic or another GLP outpt. Pt is also interested in this option. I have also discussed possibility of SGLT-2 at time of d/c.   Will run benefits check on GLP's and SGLT-2's to see copay for pt at time of d/c.  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   Will need glucose meter kit at time of d/c. Order # [id number]  Thanks,  Tama Headings RN, MSN, BC-ADM Inpatient Diabetes Coordinator Team Pager [phone] (8a-5p)

## 2021-07-29 NOTE — H&P (View-Only) (Signed)
Progress Note  Patient Name: Benjamin Small Date of Encounter: 07/29/2021  [facility] Cardiologist: None   Subjective   No complaints this morning. Planned for cardiac cath today.   Inpatient Medications    Scheduled Meds:  amLODipine  10 mg Oral QPM   aspirin  324 mg Oral Once   aspirin EC  81 mg Oral Daily   atorvastatin  40 mg Oral Daily   carvedilol  25 mg Oral BID WC   enoxaparin (LOVENOX) injection  80 mg Subcutaneous Q24H   escitalopram  20 mg Oral Daily   hydrALAZINE  25 mg Oral Q8H   insulin aspart  0-5 Units Subcutaneous QHS   insulin aspart  0-9 Units Subcutaneous TID WC   levothyroxine  200 mcg Oral Q0600   lisinopril  40 mg Oral QPM   nicotine  21 mg Transdermal Daily   sodium chloride flush  3 mL Intravenous Q12H   spironolactone  25 mg Oral QPM   tamsulosin  0.4 mg Oral QPM   Continuous Infusions:  sodium chloride     sodium chloride 1 mL/kg/hr (07/29/21 0630)   PRN Meds: sodium chloride, acetaminophen **OR** acetaminophen, albuterol, ALPRAZolam, hydrALAZINE, ondansetron **OR** ondansetron (ZOFRAN) IV, sodium chloride flush   Vital Signs    Vitals:   07/28/21 1920 07/28/21 2134 07/29/21 0458 07/29/21 0811  BP:  (!) 169/105 (!) 168/104 (!) 159/105  Pulse:  65 67 63  Resp:   18 18  Temp:  98.4 F (36.9 C) 98 F (36.7 C) 98 F (36.7 C)  TempSrc:  Oral Oral Oral  SpO2:  96% 99%   Weight: (!) 158.7 kg     Height:        Intake/Output Summary (Last 24 hours) at 07/29/2021 0818 Last data filed at 07/28/2021 2150 Gross per 24 hour  Intake 723 ml  Output --  Net 723 ml      07/28/2021    7:20 PM 07/25/2021    9:18 PM 01/30/2019    7:37 AM  Last 3 Weights  Weight (lbs) 349 lb 13.9 oz 350 lb 325 lb  Weight (kg) 158.7 kg 158.759 kg 147.419 kg      Telemetry    Sinus Bradycardia - Personally Reviewed  ECG    No new tracing  Physical Exam   GEN: No acute distress.   Neck: No JVD Cardiac: RRR, no murmurs, rubs, or gallops.   Respiratory: Clear to auscultation bilaterally. GI: Soft, nontender, non-distended  MS: No edema; No deformity. Neuro:  Nonfocal  Psych: Normal affect   Labs    High Sensitivity Troponin:   Recent Labs  Lab 07/25/21 2135 07/25/21 2339 07/26/21 0624  TROPONINIHS 35* 37* 33*     Chemistry Recent Labs  Lab 07/25/21 2135 07/25/21 2339 07/26/21 0342 07/29/21 0453  NA 139  --  139 138  K 3.1*  --  3.2* 3.7  CL 102  --  105 103  CO2 27  --  28 26  GLUCOSE 143*  --  126* 118*  BUN 25*  --  23* 15  CREATININE 1.12 1.20 1.23 1.16  CALCIUM 9.1  --  8.9 8.5*  MG  --  2.2 2.0  --   PROT  --   --  6.4*  --   ALBUMIN  --   --  3.6  --   AST  --   --  23  --   ALT  --   --  31  --  ALKPHOS  --   --  68  --   BILITOT  --   --  0.3  --   GFRNONAA >60 >60 >60 >60  ANIONGAP 10  --  6 9    Lipids  Recent Labs  Lab 07/26/21 0342  CHOL 150  TRIG 101  HDL 32*  LDLCALC 98  CHOLHDL 4.7    Hematology Recent Labs  Lab 07/25/21 2339 07/26/21 0342 07/29/21 0453  WBC 10.6* 9.3 9.1  RBC 4.91 4.59 4.66  HGB 15.2 14.1 14.5  HCT 43.8 40.5 41.4  MCV 89.2 88.2 88.8  MCH 31.0 30.7 31.1  MCHC 34.7 34.8 35.0  RDW 13.5 13.6 13.2  PLT 248 228 229   Thyroid  Recent Labs  Lab 07/26/21 0342  TSH 6.446*    BNPNo results for input(s): BNP, PROBNP in the last 168 hours.  DDimer No results for input(s): DDIMER in the last 168 hours.   Radiology    No results found.  Cardiac Studies   Echo: 07/26/21   1. Left ventricular ejection fraction, by estimation, is 60 to 65%. The left ventricle has normal function. The left ventricle has no regional wall motion abnormalities. There is moderate left ventricular hypertrophy.  Left ventricular diastolic parameters are consistent with Grade I diastolic dysfunction (impaired  relaxation).   2. Right ventricular systolic function is normal. The right ventricular size is normal.   3. Left atrial size was mildly dilated.   4. The mitral  valve is grossly normal. Trivial mitral valve  regurgitation.   5. The aortic valve was not well visualized. Aortic valve regurgitation  is not visualized. No aortic stenosis is present.   6. Aortic dilatation noted. There is borderline dilatation of the  ascending aorta, measuring 38 mm.   7. The inferior vena cava is dilated in size with >50% respiratory  variability, suggesting right atrial pressure of 8 mmHg.   Comparison(s): No prior Echocardiogram.   FINDINGS   Left Ventricle: Left ventricular ejection fraction, by estimation, is 60  to 65%. The left ventricle has normal function. The left ventricle has no  regional wall motion abnormalities. Definity contrast agent was given IV  to delineate the left ventricular   endocardial borders. The left ventricular internal cavity size was normal  in size. There is moderate left ventricular hypertrophy. Left ventricular  diastolic parameters are consistent with Grade I diastolic dysfunction  (impaired relaxation).  Indeterminate filling pressures.   Right Ventricle: The right ventricular size is normal. No increase in  right ventricular wall thickness. Right ventricular systolic function is  normal.   Left Atrium: Left atrial size was mildly dilated.   Right Atrium: Right atrial size was normal in size.   Pericardium: There is no evidence of pericardial effusion.   Mitral Valve: The mitral valve is grossly normal. Trivial mitral valve  regurgitation. MV peak gradient, 5.9 mmHg. The mean mitral valve gradient  is 1.0 mmHg.   Tricuspid Valve: The tricuspid valve is grossly normal. Tricuspid valve  regurgitation is trivial.   Aortic Valve: The aortic valve was not well visualized. Aortic valve  regurgitation is not visualized. No aortic stenosis is present. Aortic  valve mean gradient measures 4.0 mmHg. Aortic valve peak gradient measures  9.0 mmHg. Aortic valve area, by VTI  measures 3.38 cm.   Pulmonic Valve: The pulmonic  valve was grossly normal. Pulmonic valve  regurgitation is trivial.   Aorta: Aortic dilatation noted. There is borderline dilatation of the  ascending   aorta, measuring 38 mm.   Venous: The inferior vena cava is dilated in size with greater than 50%  respiratory variability, suggesting right atrial pressure of 8 mmHg.   IAS/Shunts: No atrial level shunt detected by color flow Doppler.   Patient Profile     51 y.o. male with history of hypertension, DM, OSA and smoking who presented with chest pain.   Assessment & Plan    Chest pain: negative hsTn. EKG showed TWI in inferolateral leads. Planned for cardiac cath today. Echo showed LVEF of 60-65% with no rWMA  -- continue ASA, statin, Coreg 25mg  BID, hydralazine 25mg  TID, lisinopril 40mg  daily, spiro 25mg  daily, norvasc 10mg  daily   HTN: Blood pressures remain elevated -- continue coreg 25mg  BID, lisinopril 40mg  daily, spiro 25mg  daily, norvasc 10mg  daily, increase hydralazine to 50mg  TID  HLD: LDL 98 -- on atorvastatin 40mg  daily, increase to 80mg  daily   DM: Hgb A1c 6.5 -- on SSI  Hypothyroidism: TSH 6.4 -- on synthroid 200 mcg daily   OSA: on Cpap  Tobacco use: on nicotine patch -- cessation advised   For questions or updates, please contact [facility] Please consult [email or web address] for contact info under        Signed, , NP  07/29/2021, 8:18 AM    ATTENDING ATTESTATION:  After conducting a review of all available clinical information with the care team, interviewing the patient, and performing a physical exam, I agree with the findings and plan described in this note.   GEN: No acute distress.   HEENT:  MMM, no JVD, no scleral icterus Cardiac: RRR, no murmurs, rubs, or gallops.  Respiratory: Clear to auscultation bilaterally. GI: Soft, nontender, non-distended  MS: No edema; No deformity. Neuro:  Nonfocal  Vasc:  +2 radial pulses  Patient remains chest pain free this morning without event on  tele.  Cont current medical therapy with BB, high dose atorvastatin, norvasc, and lisinopril.  Increasing hydralzine for better BP control.  Could consider changing coreg to bystolic for BP control if needed.  Coronary angiography today.  If reassuring, will need outpatient evaluation for noncardiac chest pain.  I have reviewed the risks, indications, and alternatives to cardiac catheterization, possible angioplasty, and stenting with the patient. Risks include but are not limited to bleeding, infection, vascular injury, stroke, myocardial infection, arrhythmia, kidney injury, radiation-related injury in the case of prolonged fluoroscopy use, emergency cardiac surgery, and death. The patient understands the risks of serious complication is 1-2 in 1000 with diagnostic cardiac cath and 1-2% or less with angioplasty/stenting.    , MD Pager [phone]

## 2021-07-29 NOTE — Progress Notes (Incomplete Revision)
Inpatient Diabetes Program Recommendations  AACE/ADA: New Consensus Statement on Inpatient Glycemic Control (2015)  Target Ranges:  Prepandial:   less than 140 mg/dL      Peak postprandial:   less than 180 mg/dL (1-2 hours)      Critically ill patients:  140 - 180 mg/dL   Lab Results  Component Value Date   GLUCAP 83 07/29/2021   HGBA1C 6.5 (H) 07/26/2021    Review of Glycemic Control  Diabetes history:  DM 2 Outpatient Diabetes medications: metformin in the past with side effects Current orders for Inpatient glycemic control:  Novolog 0-9 units tid + hs  A1c 6.5% on 5/27    Pt desires to loose weight in addition to being concerned about glucose levels outpatient.  Pt would be a better candidate for Ozempic or another GLP outpt. Pt is also interested in this option.   I have also discussed possibility of SGLT-2 at time of d/c just in case.   Will run benefits check on GLP's and SGLT-2's to see copay for pt at time of d/c.  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    Ozempic $4 copay per Benefits check by Lyndel Safe CPhT Everything else needs pre Authorization ( which can be started here)  Will need glucose meter kit at time of d/c. Order # [id number]  Thanks,  Tama Headings RN, MSN, BC-ADM Inpatient Diabetes Coordinator Team Pager [phone] (8a-5p)

## 2021-07-29 NOTE — Plan of Care (Signed)
RD consulted for nutrition education regarding weight loss and DM.  Lab Results  Component Value Date   HGBA1C 6.5 (H) 07/26/2021   Body mass index is 51.67 kg/m. Pt meets criteria for obesity class III based on current BMI.  RD provided "Weight Loss Tips" and "Carbohydrate Counting for People with Diabetes" handouts from the Academy of Nutrition and Dietetics. Emphasized the importance of serving sizes and provided examples of correct portions of common foods. Discussed importance of controlled and consistent carbohydrate intake throughout the day. Provided examples of ways to balance meals/snacks and encouraged intake of high-fiber, whole grain complex carbohydrates. Emphasized the importance of hydration with calorie-free beverages and limiting sugar-sweetened beverages.   Encouraged pt to discuss physical activity options with physician. Teach back method used.  Expect *** compliance.  Current diet order is NPO as pt has a procedure today, was Heart Healthy prior to NPO, patient is consuming approximately 100% of meals at this time. Labs and medications reviewed. No further nutrition interventions warranted at this time. RD contact information provided. If additional nutrition issues arise, please re-consult RD.  Benjamin Small, RD, LDN Clinical Dietitian RD pager # available in AMION  After hours/weekend pager # available in [facility]

## 2021-07-29 NOTE — Progress Notes (Signed)
  Transition of Care [facility]) Screening Note   Patient Details  Name: Benjamin Small Date of Birth: 06-22-1970   Transition of Care [facility]) CM/SW Contact:    Delilah Shan, LCSWA Phone Number: 07/29/2021, 4:48 PM    Transition of Care Department [facility]) has reviewed patient and no TOC needs have been identified at this time. We will continue to monitor patient advancement through interdisciplinary progression rounds. If new patient transition needs arise, please place a TOC consult.

## 2021-07-29 NOTE — Assessment & Plan Note (Signed)
--  with some volume overload on exam --noted on LHC --diuresis, BMP in AM

## 2021-07-29 NOTE — Progress Notes (Signed)
Patient using home CPAP unit independently. Told pt to call if he had any trouble or needed help.

## 2021-07-29 NOTE — Progress Notes (Signed)
  Progress Note   Patient: Benjamin Small [id number] DOB: Feb 26, 1971 DOA: 07/25/2021     3 DOS: the patient was seen and examined on 07/29/2021   Brief hospital course: 62yom PMH OSA, ADD, HTN presented with retrosternal chest pain, diaphoresis and shortness of breath in the context of significantly elevated systolic blood pressure.  Had a CTA of the chest abdomen pelvis with no aortic dissection.  Left AMA.  Continued to have substernal chest pain, seen by cardiology and sent to the emergency department, admitted 5/26 for further evaluation of chest pain. ACS ruled out. Now s/p LHC 5/30 with normal coronaries. Plan medical management. Home 5/31.  Assessment and Plan: * Chest pain --ACS ruled out. Echocardiogram showed LVEF 60-65%, no WMA. Troponins flat in 30s.  --LHC with normal coronaries. Treat acute/chronic diastolic CHF, BP  --Continue antihypertensives, aspirin, statin.  Acute on chronic diastolic HF (heart failure) (HCC) --with some volume overload on exam --noted on LHC --diuresis, BMP in AM  DM type 2 (diabetes mellitus, type 2) ([place]) --mildly hyperglycemic, Hgb A1c 6.5 --home on SGLT2 (requires prior-authorization) and Ozempic --will need glucose meter kit at time of d/c. Order # [id number]  Hypothyroidism --TSH 6.446. follow-up as an outpatient. Continue levothyroxine   Essential hypertension --remains elevated but better controlled now with addition of hydralazine; continue carvedilol, Lasix, ARB, spironolactone, amlodipine, hydralazine  Hypokalemia-resolved as of 07/27/2021 --repleted. Mg 2+ WNL.  Morbid obesity with BMI of 50.0-59.9, adult (HCC) --Body mass index is 51.69 kg/m. --dietician consult placed --weight loss recommended, hopefully Ozempic will help  Dyslipidemia --continue statin  Tobacco abuse --recommend cessation  OSA (obstructive sleep apnea) --continue CPAP        Subjective:  Feels good No  CP No SOB  Physical Exam: Vitals:    07/29/21 0811 07/29/21 1300 07/29/21 1342 07/29/21 1735  BP: (!) 159/105  (!) 162/106 (!) 147/93  Pulse: 63  (!) 57   Resp: 18  (!) 22   Temp: 98 F (36.7 C)  97.7 F (36.5 C)   TempSrc: Oral  Oral   SpO2:  98% 96%   Weight:      Height:       Physical Exam Vitals reviewed.  Constitutional:      General: He is not in acute distress.    Appearance: He is not ill-appearing or toxic-appearing.  Cardiovascular:     Rate and Rhythm: Normal rate and regular rhythm.     Heart sounds: No murmur heard. Pulmonary:     Effort: Pulmonary effort is normal. No respiratory distress.     Breath sounds: No wheezing, rhonchi or rales.  Neurological:     Mental Status: He is alert.  Psychiatric:        Mood and Affect: Mood normal.        Behavior: Behavior normal.    Data Reviewed:  CBG stable BMP noted CBC stable  Family Communication: wife at bedside  Disposition: Status is: Inpatient Remains inpatient appropriate because: needs LHC, acute on chronic diastolic CHF  Planned Discharge Destination: Home    Time spent: 35 minutes  Author: Murray Hodgkins, MD 07/29/2021 6:02 PM  For on call review [email or web address].

## 2021-07-29 NOTE — Interval H&P Note (Signed)
Cath Lab Visit (complete for each Cath Lab visit)  Clinical Evaluation Leading to the Procedure:   ACS: No.  Non-ACS:    Anginal Classification: CCS III  Anti-ischemic medical therapy: Minimal Therapy (1 class of medications)  Non-Invasive Test Results: No non-invasive testing performed  Prior CABG: No previous CABG      History and Physical Interval Note:  07/29/2021 11:38 AM  Benjamin Small  has presented today for surgery, with the diagnosis of unstable angina.  The various methods of treatment have been discussed with the patient and family. After consideration of risks, benefits and other options for treatment, the patient has consented to  Procedure(s): LEFT HEART CATH AND CORONARY ANGIOGRAPHY (N/A) as a surgical intervention.  The patient's history has been reviewed, patient examined, no change in status, stable for surgery.  I have reviewed the patient's chart and labs.  Questions were answered to the patient's satisfaction.     Lyn Records III

## 2021-07-29 NOTE — TOC Benefit Eligibility Note (Signed)
Patient Product/process development scientist completed.    The patient is currently admitted and upon discharge could be taking Ozempic 2 mg/3 ml.  The current 30 day co-pay is, $4.00.   The patient is currently admitted and upon discharge could be taking Farxiga 10 mg.  Requires Prior Authorization  The patient is currently admitted and upon discharge could be taking Jardiance 10 mg.  Requires Prior Authorization  The patient is currently admitted and upon discharge could be taking Invokana 100 mg.  Requires Prior Authorization  The patient is currently admitted and upon discharge could be taking Mounjaro 2.5 mg/0.5 ml.  Requires Prior Authorization  The patient is insured through [place] [employer] [place] [employer]     Roland Earl, CPhT Pharmacy Patient Advocate Specialist [facility] Patient Advocate Team Direct Number: [phone]  Fax: [phone]

## 2021-07-29 NOTE — Progress Notes (Addendum)
Progress Note  Patient Name: Benjamin Small Date of Encounter: 07/29/2021  [facility] Cardiologist: None   Subjective   No complaints this morning. Planned for cardiac cath today.   Inpatient Medications    Scheduled Meds:  amLODipine  10 mg Oral QPM   aspirin  324 mg Oral Once   aspirin EC  81 mg Oral Daily   atorvastatin  40 mg Oral Daily   carvedilol  25 mg Oral BID WC   enoxaparin (LOVENOX) injection  80 mg Subcutaneous Q24H   escitalopram  20 mg Oral Daily   hydrALAZINE  25 mg Oral Q8H   insulin aspart  0-5 Units Subcutaneous QHS   insulin aspart  0-9 Units Subcutaneous TID WC   levothyroxine  200 mcg Oral Q0600   lisinopril  40 mg Oral QPM   nicotine  21 mg Transdermal Daily   sodium chloride flush  3 mL Intravenous Q12H   spironolactone  25 mg Oral QPM   tamsulosin  0.4 mg Oral QPM   Continuous Infusions:  sodium chloride     sodium chloride 1 mL/kg/hr (07/29/21 0630)   PRN Meds: sodium chloride, acetaminophen **OR** acetaminophen, albuterol, ALPRAZolam, hydrALAZINE, ondansetron **OR** ondansetron (ZOFRAN) IV, sodium chloride flush   Vital Signs    Vitals:   07/28/21 1920 07/28/21 2134 07/29/21 0458 07/29/21 0811  BP:  (!) 169/105 (!) 168/104 (!) 159/105  Pulse:  65 67 63  Resp:   18 18  Temp:  98.4 F (36.9 C) 98 F (36.7 C) 98 F (36.7 C)  TempSrc:  Oral Oral Oral  SpO2:  96% 99%   Weight: (!) 158.7 kg     Height:        Intake/Output Summary (Last 24 hours) at 07/29/2021 0818 Last data filed at 07/28/2021 2150 Gross per 24 hour  Intake 723 ml  Output --  Net 723 ml      07/28/2021    7:20 PM 07/25/2021    9:18 PM 01/30/2019    7:37 AM  Last 3 Weights  Weight (lbs) 349 lb 13.9 oz 350 lb 325 lb  Weight (kg) 158.7 kg 158.759 kg 147.419 kg      Telemetry    Sinus Bradycardia - Personally Reviewed  ECG    No new tracing  Physical Exam   GEN: No acute distress.   Neck: No JVD Cardiac: RRR, no murmurs, rubs, or gallops.   Respiratory: Clear to auscultation bilaterally. GI: Soft, nontender, non-distended  MS: No edema; No deformity. Neuro:  Nonfocal  Psych: Normal affect   Labs    High Sensitivity Troponin:   Recent Labs  Lab 07/25/21 2135 07/25/21 2339 07/26/21 0624  TROPONINIHS 35* 37* 33*     Chemistry Recent Labs  Lab 07/25/21 2135 07/25/21 2339 07/26/21 0342 07/29/21 0453  NA 139  --  139 138  K 3.1*  --  3.2* 3.7  CL 102  --  105 103  CO2 27  --  28 26  GLUCOSE 143*  --  126* 118*  BUN 25*  --  23* 15  CREATININE 1.12 1.20 1.23 1.16  CALCIUM 9.1  --  8.9 8.5*  MG  --  2.2 2.0  --   PROT  --   --  6.4*  --   ALBUMIN  --   --  3.6  --   AST  --   --  23  --   ALT  --   --  31  --  ALKPHOS  --   --  68  --   BILITOT  --   --  0.3  --   GFRNONAA >60 >60 >60 >60  ANIONGAP 10  --  6 9    Lipids  Recent Labs  Lab 07/26/21 0342  CHOL 150  TRIG 101  HDL 32*  LDLCALC 98  CHOLHDL 4.7    Hematology Recent Labs  Lab 07/25/21 2339 07/26/21 0342 07/29/21 0453  WBC 10.6* 9.3 9.1  RBC 4.91 4.59 4.66  HGB 15.2 14.1 14.5  HCT 43.8 40.5 41.4  MCV 89.2 88.2 88.8  MCH 31.0 30.7 31.1  MCHC 34.7 34.8 35.0  RDW 13.5 13.6 13.2  PLT 248 228 229   Thyroid  Recent Labs  Lab 07/26/21 0342  TSH 6.446*    BNPNo results for input(s): BNP, PROBNP in the last 168 hours.  DDimer No results for input(s): DDIMER in the last 168 hours.   Radiology    No results found.  Cardiac Studies   Echo: 07/26/21   1. Left ventricular ejection fraction, by estimation, is 60 to 65%. The left ventricle has normal function. The left ventricle has no regional wall motion abnormalities. There is moderate left ventricular hypertrophy.  Left ventricular diastolic parameters are consistent with Grade I diastolic dysfunction (impaired  relaxation).   2. Right ventricular systolic function is normal. The right ventricular size is normal.   3. Left atrial size was mildly dilated.   4. The mitral  valve is grossly normal. Trivial mitral valve  regurgitation.   5. The aortic valve was not well visualized. Aortic valve regurgitation  is not visualized. No aortic stenosis is present.   6. Aortic dilatation noted. There is borderline dilatation of the  ascending aorta, measuring 38 mm.   7. The inferior vena cava is dilated in size with >50% respiratory  variability, suggesting right atrial pressure of 8 mmHg.   Comparison(s): No prior Echocardiogram.   FINDINGS   Left Ventricle: Left ventricular ejection fraction, by estimation, is 60  to 65%. The left ventricle has normal function. The left ventricle has no  regional wall motion abnormalities. Definity contrast agent was given IV  to delineate the left ventricular   endocardial borders. The left ventricular internal cavity size was normal  in size. There is moderate left ventricular hypertrophy. Left ventricular  diastolic parameters are consistent with Grade I diastolic dysfunction  (impaired relaxation).  Indeterminate filling pressures.   Right Ventricle: The right ventricular size is normal. No increase in  right ventricular wall thickness. Right ventricular systolic function is  normal.   Left Atrium: Left atrial size was mildly dilated.   Right Atrium: Right atrial size was normal in size.   Pericardium: There is no evidence of pericardial effusion.   Mitral Valve: The mitral valve is grossly normal. Trivial mitral valve  regurgitation. MV peak gradient, 5.9 mmHg. The mean mitral valve gradient  is 1.0 mmHg.   Tricuspid Valve: The tricuspid valve is grossly normal. Tricuspid valve  regurgitation is trivial.   Aortic Valve: The aortic valve was not well visualized. Aortic valve  regurgitation is not visualized. No aortic stenosis is present. Aortic  valve mean gradient measures 4.0 mmHg. Aortic valve peak gradient measures  9.0 mmHg. Aortic valve area, by VTI  measures 3.38 cm.   Pulmonic Valve: The pulmonic  valve was grossly normal. Pulmonic valve  regurgitation is trivial.   Aorta: Aortic dilatation noted. There is borderline dilatation of the  ascending  aorta, measuring 38 mm.   Venous: The inferior vena cava is dilated in size with greater than 50%  respiratory variability, suggesting right atrial pressure of 8 mmHg.   IAS/Shunts: No atrial level shunt detected by color flow Doppler.   Patient Profile     51 y.o. male with history of hypertension, DM, OSA and smoking who presented with chest pain.   Assessment & Plan    Chest pain: negative hsTn. EKG showed TWI in inferolateral leads. Planned for cardiac cath today. Echo showed LVEF of 60-65% with no rWMA  -- continue ASA, statin, Coreg 25mg  BID, hydralazine 25mg  TID, lisinopril 40mg  daily, spiro 25mg  daily, norvasc 10mg  daily   HTN: Blood pressures remain elevated -- continue coreg 25mg  BID, lisinopril 40mg  daily, spiro 25mg  daily, norvasc 10mg  daily, increase hydralazine to 50mg  TID  HLD: LDL 98 -- on atorvastatin 40mg  daily, increase to 80mg  daily   DM: Hgb A1c 6.5 -- on SSI  Hypothyroidism: TSH 6.4 -- on synthroid 200 mcg daily   OSA: on Cpap  Tobacco use: on nicotine patch -- cessation advised   For questions or updates, please contact [facility] Please consult [email or web address] for contact info under        Signed, , NP  07/29/2021, 8:18 AM    ATTENDING ATTESTATION:  After conducting a review of all available clinical information with the care team, interviewing the patient, and performing a physical exam, I agree with the findings and plan described in this note.   GEN: No acute distress.   HEENT:  MMM, no JVD, no scleral icterus Cardiac: RRR, no murmurs, rubs, or gallops.  Respiratory: Clear to auscultation bilaterally. GI: Soft, nontender, non-distended  MS: No edema; No deformity. Neuro:  Nonfocal  Vasc:  +2 radial pulses  Patient remains chest pain free this morning without event on  tele.  Cont current medical therapy with BB, high dose atorvastatin, norvasc, and lisinopril.  Increasing hydralzine for better BP control.  Could consider changing coreg to bystolic for BP control if needed.  Coronary angiography today.  If reassuring, will need outpatient evaluation for noncardiac chest pain.  I have reviewed the risks, indications, and alternatives to cardiac catheterization, possible angioplasty, and stenting with the patient. Risks include but are not limited to bleeding, infection, vascular injury, stroke, myocardial infection, arrhythmia, kidney injury, radiation-related injury in the case of prolonged fluoroscopy use, emergency cardiac surgery, and death. The patient understands the risks of serious complication is 1-2 in 1000 with diagnostic cardiac cath and 1-2% or less with angioplasty/stenting.    , MD Pager [phone]

## 2021-07-30 ENCOUNTER — Encounter (HOSPITAL_COMMUNITY): Payer: Self-pay | Admitting: Interventional Cardiology

## 2021-07-30 ENCOUNTER — Other Ambulatory Visit (HOSPITAL_COMMUNITY): Payer: Self-pay

## 2021-07-30 DIAGNOSIS — I5033 Acute on chronic diastolic (congestive) heart failure: Secondary | ICD-10-CM | POA: Diagnosis not present

## 2021-07-30 DIAGNOSIS — R072 Precordial pain: Secondary | ICD-10-CM | POA: Diagnosis not present

## 2021-07-30 LAB — GLUCOSE, CAPILLARY: Glucose-Capillary: 164 mg/dL — ABNORMAL HIGH (ref 70–99)

## 2021-07-30 LAB — BASIC METABOLIC PANEL
Anion gap: 5 (ref 5–15)
BUN: 17 mg/dL (ref 6–20)
CO2: 28 mmol/L (ref 22–32)
Calcium: 8.9 mg/dL (ref 8.9–10.3)
Chloride: 105 mmol/L (ref 98–111)
Creatinine, Ser: 1.19 mg/dL (ref 0.61–1.24)
GFR, Estimated: >60 mL/min (ref >60)
Glucose, Bld: 113 mg/dL — ABNORMAL HIGH (ref 70–99)
Potassium: 3.5 mmol/L (ref 3.5–5.1)
Sodium: 138 mmol/L (ref 135–145)

## 2021-07-30 LAB — CBC
HCT: 40.5 % (ref 39.0–52.0)
Hemoglobin: 13.6 g/dL (ref 13.0–17.0)
MCH: 30.2 pg (ref 26.0–34.0)
MCHC: 33.6 g/dL (ref 30.0–36.0)
MCV: 89.8 fL (ref 80.0–100.0)
Platelets: 218 10*3/uL (ref 150–400)
RBC: 4.51 MIL/uL (ref 4.22–5.81)
RDW: 13.2 % (ref 11.5–15.5)
WBC: 9.2 10*3/uL (ref 4.0–10.5)
nRBC: 0 % (ref 0.0–0.2)

## 2021-07-30 MED ORDER — POTASSIUM CHLORIDE CRYS ER 20 MEQ PO TBCR
40.0000 meq | EXTENDED_RELEASE_TABLET | Freq: Once | ORAL | Status: AC
  Administered 2021-07-30: 40 meq via ORAL
  Filled 2021-07-30: qty 2

## 2021-07-30 MED ORDER — LOSARTAN POTASSIUM 50 MG PO TABS: 50.0000 mg | ORAL_TABLET | Freq: Two times a day (BID) | ORAL | 0 refills | Status: DC

## 2021-07-30 MED ORDER — EMPAGLIFLOZIN 10 MG PO TABS: 10.0000 mg | ORAL_TABLET | Freq: Every day | ORAL | 1 refills | Status: DC

## 2021-07-30 MED ORDER — ATORVASTATIN CALCIUM 10 MG PO TABS
20.0000 mg | ORAL_TABLET | Freq: Every day | ORAL | Status: DC
  Administered 2021-07-30: 20 mg via ORAL
  Filled 2021-07-30: qty 2

## 2021-07-30 MED ORDER — ATORVASTATIN CALCIUM 20 MG PO TABS: 20.0000 mg | ORAL_TABLET | Freq: Every day | ORAL | 0 refills | Status: DC

## 2021-07-30 MED ORDER — FUROSEMIDE 40 MG PO TABS: 40.0000 mg | ORAL_TABLET | Freq: Every day | ORAL | Status: DC

## 2021-07-30 MED ORDER — LOSARTAN POTASSIUM 50 MG PO TABS
50.0000 mg | ORAL_TABLET | Freq: Two times a day (BID) | ORAL | Status: DC
  Administered 2021-07-30: 50 mg via ORAL
  Filled 2021-07-30: qty 1

## 2021-07-30 MED ORDER — ATORVASTATIN CALCIUM 80 MG PO TABS: 80.0000 mg | ORAL_TABLET | Freq: Every day | ORAL | Status: DC

## 2021-07-30 MED ORDER — SEMAGLUTIDE(0.25 OR 0.5MG/DOS) 2 MG/3ML ~~LOC~~ SOPN: 0.2500 mg | PEN_INJECTOR | SUBCUTANEOUS | 1 refills | Status: DC

## 2021-07-30 MED ORDER — FUROSEMIDE 40 MG PO TABS: 40.0000 mg | ORAL_TABLET | Freq: Every evening | ORAL | 0 refills | Status: DC

## 2021-07-30 MED ORDER — INSULIN PEN NEEDLE 32G X 4 MM MISC: 1.0000 | Freq: Two times a day (BID) | 0 refills | Status: DC

## 2021-07-30 MED ORDER — BLOOD GLUCOSE METER KIT: PACK | 0 refills | Status: AC

## 2021-07-30 NOTE — TOC Benefit Eligibility Note (Signed)
Patient Advocate Encounter  Prior Authorization for Jardiance 10MG  tablets has been approved.    PA# Effective dates: 07/30/2021 through 07/30/2022  Patients co-pay is $4.00.     08/01/2022, CPhT Pharmacy Patient Advocate Specialist [facility] Patient Advocate Team Direct Number: [phone]  Fax: [phone]

## 2021-07-30 NOTE — Progress Notes (Signed)
Inpatient Diabetes Program Recommendations  AACE/ADA: New Consensus Statement on Inpatient Glycemic Control (2015)  Target Ranges:  Prepandial:   less than 140 mg/dL      Peak postprandial:   less than 180 mg/dL (1-2 hours)      Critically ill patients:  140 - 180 mg/dL   Lab Results  Component Value Date   GLUCAP 164 (H) 07/30/2021   HGBA1C 6.5 (H) 07/26/2021    Review of Glycemic Control  Diabetes history:  DM 2 Outpatient Diabetes medications: metformin in the past with side effects Current orders for Inpatient glycemic control:  Novolog 0-9 units tid + hs Jardiance 10 mg Daily  Outpatient Recommendations:   Jardiance 10 mg Daily Ozempic 0.25 mg weekly Insulin pen needles Glucose meter kit  Spoke with Dr. Tawanna Solo regarding plan of care via Secure chat.  Spoke with pt at bedside. Showed pt how to operate GLP injectable at home. Attached information on new home meds to AVS.  Thanks,  Tama Headings RN, MSN, BC-ADM Inpatient Diabetes Coordinator Team Pager [phone] (8a-5p)

## 2021-07-30 NOTE — Plan of Care (Signed)

## 2021-07-30 NOTE — Discharge Summary (Signed)
Physician Discharge Summary  Benjamin Small TDV:[id number] DOB: [birth year] DOA: 07/25/2021  PCP: Pcp, No  Admit date: 07/25/2021 Discharge date: 07/30/2021  Admitted From: Home Disposition:  Home  Discharge Condition:Stable CODE STATUS:FULL Diet recommendation: Heart Healthy  Brief/Interim Summary: 18yom PMH OSA, ADD, HTN presented with retrosternal chest pain, diaphoresis and shortness of breath in the context of significantly elevated systolic blood pressure.  Had a CTA of the chest abdomen pelvis with no aortic dissection.  Left AMA.  Continued to have substernal chest pain, seen by cardiology and sent to the emergency department, admitted 5/26 for further evaluation of chest pain. ACS ruled out. Now s/p LHC 5/30 with normal coronaries. Plan medical management.  Stable for discharge today.  Following problems were addressed during his hospitalization:  Chest pain --ACS ruled out. Echocardiogram showed LVEF 60-65%, no WMA. Troponins flat in 30s.  --LHC with normal coronaries. Treat acute/chronic diastolic CHF, BP  --Continue antihypertensives, aspirin, statin.   Acute on chronic diastolic HF (heart failure) (HCC) --with some volume overload on exam --noted on LHC -Continue Lasix, spironolactone at home.   DM type 2 (diabetes mellitus, type 2) ([place]) --mildly hyperglycemic, Hgb A1c 6.5 --Plan to put him on  SGLT2 (requires prior-authorization) and Ozempic   Hypothyroidism --TSH 6.446. follow-up as an outpatient. Continue levothyroxine    Essential hypertension --remains elevated but better controlled now with addition of hydralazine; continue carvedilol, Lasix, ARB, spironolactone, amlodipine, hydralazine   Hypokalemia-resolved as of 07/27/2021 --repleted. Mg 2+ WNL.   Morbid obesity with BMI of 50.0-59.9, adult (HCC) --Body mass index is 51.69 kg/m. --dietician consult placed --weight loss recommended, hopefully Ozempic will help   Dyslipidemia --continue statin    Tobacco abuse --recommend cessation   OSA (obstructive sleep apnea) --continue CPAP             Discharge Diagnoses:  Principal Problem:   Chest pain Active Problems:   Acute on chronic diastolic HF (heart failure) (HCC)   Essential hypertension   Hypothyroidism   DM type 2 (diabetes mellitus, type 2) ([place])   Morbid obesity with BMI of 50.0-59.9, adult (HCC)   OSA (obstructive sleep apnea)   Tobacco abuse   Dyslipidemia   Obesity    Discharge Instructions  Discharge Instructions     Diet - low sodium heart healthy   Complete by: As directed    Discharge instructions   Complete by: As directed    1)Please take prescribed medications as instructed 2)Follow up with your cardiologist as an outpatient 3)Monitor your blood sugars at home.   Increase activity slowly   Complete by: As directed       Allergies as of 07/30/2021   No Known Allergies      Medication List     STOP taking these medications    lisinopril 40 MG tablet Commonly known as: ZESTRIL   losartan-hydrochlorothiazide 100-25 MG tablet Commonly known as: HYZAAR       TAKE these medications    ALPRAZolam 1 MG tablet Commonly known as: XANAX Take 1 mg by mouth 3 (three) times daily as needed for anxiety.   amLODipine 10 MG tablet Commonly known as: NORVASC Take 10 mg by mouth every evening.   amphetamine-dextroamphetamine 30 MG 24 hr capsule Commonly known as: ADDERALL XR Take 60 mg by mouth daily.   atorvastatin 20 MG tablet Commonly known as: LIPITOR Take 1 tablet (20 mg total) by mouth daily. Start taking on: July 31, 2021   blood glucose meter kit and supplies Dispense  based on patient and insurance preference. Use up to four times daily as directed. (FOR ICD-10 E10.9, E11.9).   carvedilol 25 MG tablet Commonly known as: Coreg Take 1 tablet (25 mg total) by mouth 2 (two) times daily with a meal.   empagliflozin 10 MG Tabs tablet Commonly known as: JARDIANCE Take 1  tablet (10 mg total) by mouth daily. Start taking on: July 31, 2021   escitalopram 20 MG tablet Commonly known as: LEXAPRO Take 20 mg by mouth daily.   fluticasone 50 MCG/ACT nasal spray Commonly known as: FLONASE Place 1 spray into both nostrils daily as needed for allergies.   furosemide 40 MG tablet Commonly known as: LASIX Take 1 tablet (40 mg total) by mouth every evening. What changed:  medication strength how much to take   ibuprofen 200 MG tablet Commonly known as: ADVIL Take 800 mg by mouth every 6 (six) hours as needed for headache or moderate pain.   Insulin Pen Needle 32G X 4 MM Misc 1 each by Does not apply route 2 (two) times daily.   levothyroxine 200 MCG tablet Commonly known as: SYNTHROID Take 200 mcg by mouth daily.   loratadine 10 MG tablet Commonly known as: CLARITIN Take 10 mg by mouth daily as needed for allergies.   losartan 50 MG tablet Commonly known as: COZAAR Take 1 tablet (50 mg total) by mouth 2 (two) times daily.   nicotine 21 mg/24hr patch Commonly known as: NICODERM CQ - dosed in mg/24 hours 21 mg daily.   NON FORMULARY CPAP at bedtime   Semaglutide(0.25 or 0.5MG/DOS) 2 MG/3ML Sopn Inject 0.25 mg into the skin once a week.   spironolactone 25 MG tablet Commonly known as: ALDACTONE Take 25 mg by mouth every evening.   Tadalafil 2.5 MG Tabs Commonly known as: Cialis Take 1 tablet (2.5 mg total) by mouth daily as needed (Erectile Dysfunction).   tamsulosin 0.4 MG Caps capsule Commonly known as: FLOMAX Take 0.4 mg by mouth every evening.   testosterone cypionate 200 MG/ML injection Commonly known as: DEPOTESTOSTERONE CYPIONATE Inject 0.75 mLs into the muscle every _0 0.0 lb Date of Birth:  1970/08/14  BSA:          2.621 m Patient Age:    51 years          BP:           155/101 mmHg Patient Gender: M                 HR:           52 bpm. Exam Location:  Inpatient Procedure: 2D Echo, Cardiac Doppler, Color Doppler and Intracardiac            Opacification Agent Indications:    Chest Pain  History:        Patient has no prior history of Echocardiogram examinations.                 Signs/Symptoms:Chest Pain; Risk Factors:Hypertension and                 Dyslipidemia.  Sonographer:    Wenda Low Referring Phys: [id number] [place]  Sonographer Comments: Patient is morbidly obese. Image acquisition challenging due to patient body habitus. IMPRESSIONS  1. Left ventricular ejection fraction, by estimation, is 60 to 65%. The left ventricle has normal function. The left ventricle has no regional wall motion abnormalities. There is moderate left ventricular hypertrophy. Left ventricular diastolic  parameters are consistent with Grade I diastolic dysfunction (impaired relaxation).  2. Right ventricular systolic function is normal. The right ventricular size is normal.  3. Left atrial size was mildly dilated.  4. The mitral valve is grossly normal. Trivial mitral valve regurgitation.  5. The aortic valve was not well visualized. Aortic valve regurgitation is not visualized. No aortic stenosis is present.  6. Aortic dilatation noted. There is borderline dilatation of the ascending aorta, measuring 38 mm.  7. The inferior vena cava is dilated in size with >50% respiratory variability, suggesting right atrial pressure of 8 mmHg. Comparison(s): No prior Echocardiogram. FINDINGS  Left Ventricle: Left ventricular ejection fraction, by estimation, is 60 to 65%. The left ventricle has normal function. The left ventricle has no regional wall motion abnormalities. Definity contrast agent was given IV to delineate the left ventricular  endocardial borders. The left ventricular internal cavity size was normal in size. There is moderate left ventricular hypertrophy. Left ventricular diastolic parameters are consistent with Grade I diastolic dysfunction (impaired relaxation). Indeterminate filling pressures. Right Ventricle: The right ventricular size is normal. No increase in right ventricular wall thickness. Right ventricular systolic function is normal. Left Atrium: Left atrial size was mildly dilated. Right Atrium: Right atrial size was normal in size. Pericardium: There is no evidence of pericardial effusion. Mitral Valve: The mitral valve is grossly normal. Trivial mitral valve regurgitation. MV peak gradient, 5.9 mmHg. The mean mitral valve gradient is 1.0 mmHg. Tricuspid Valve: The tricuspid valve is grossly normal. Tricuspid valve regurgitation is trivial. Aortic Valve: The aortic valve was not well visualized. Aortic valve regurgitation is not visualized. No aortic stenosis is present. Aortic valve mean gradient  measures 4.0 mmHg. Aortic valve peak gradient measures 9.0 mmHg. Aortic valve area, by VTI measures 3.38 cm. Pulmonic Valve: The pulmonic valve was grossly normal. Pulmonic valve regurgitation is trivial. Aorta: Aortic dilatation noted. There is borderline dilatation of the ascending aorta, measuring 38 mm. Venous: The inferior vena cava is dilated in size with greater than 50% respiratory variability, suggesting right atrial pressure of 8 mmHg. IAS/Shunts: No atrial level shunt detected by color flow Doppler.  LEFT VENTRICLE PLAX 2D LVIDd:  6.30 cm   Diastology LVIDs:         4.30 cm   LV e' lateral:   5.93 cm/s LV PW:         1.70 cm   LV E/e' lateral: 15.0 LV IVS:        1.80 cm LVOT diam:     2.20 cm LV SV:         101 LV SV Index:   39 LVOT Area:     3.80 cm  RIGHT VENTRICLE RV Basal diam:  3.75 cm RV Mid diam:    3.10 cm LEFT ATRIUM             Index        RIGHT ATRIUM           Index LA diam:        5.00 cm 1.91 cm/m   RA Area:     23.80 cm LA Vol (A2C):   80.0 ml 30.53 ml/m  RA Volume:   77.00 ml  29.38 ml/m LA Vol (A4C):   95.9 ml 36.59 ml/m LA Biplane Vol: 87.1 ml 33.24 ml/m  AORTIC VALVE                    PULMONIC VALVE AV Area (Vmax):    2.91 cm     PV Vmax:       0.68 m/s AV Area (Vmean):   3.06 cm     PV Peak grad:  1.8 mmHg AV Area (VTI):     3.38 cm AV Vmax:           150.00 cm/s AV Vmean:          93.300 cm/s AV VTI:            0.300 m AV Peak Grad:      9.0 mmHg AV Mean Grad:      4.0 mmHg LVOT Vmax:         115.00 cm/s LVOT Vmean:        75.100 cm/s LVOT VTI:          0.267 m LVOT/AV VTI ratio: 0.89  AORTA Ao Root diam: 3.60 cm Ao Asc diam:  3.80 cm MITRAL VALVE MV Area (PHT): 2.94 cm    SHUNTS MV Area VTI:   2.69 cm    Systemic VTI:  0.27 m MV Peak grad:  5.9 mmHg    Systemic Diam: 2.20 cm MV Mean grad:  1.0 mmHg MV Vmax:       1.21 m/s MV Vmean:      48.8 cm/s MV Decel Time: 258 msec MV E velocity: 89.10 cm/s MV A velocity: 67.30 cm/s MV E/A ratio:  1.32 Lyman Bishop MD  Electronically signed by Lyman Bishop MD Signature Date/Time: 07/26/2021/5:03:29 PM    Final       Subjective: Patient seen and examined at bedside this morning.  Hemodynamically stable for discharge today.  Discharge Exam: Vitals:   07/29/21 2008 07/30/21 0520  BP: (!) 172/108 (!) 140/95  Pulse:  72  Resp:    Temp: 98.2 F (36.8 C) 98.7 F (37.1 C)  SpO2:  98%   Vitals:   07/29/21 1342 07/29/21 1735 07/29/21 2008 07/30/21 0520  BP: (!) 162/106 (!) 147/93 (!) 172/108 (!) 140/95  Pulse: (!) 57   72  Resp: (!) 22     Temp: 97.7 F (36.5 C)  98.2 F (36.8 C) 98.7 F (37.1 C)  TempSrc: Oral  Oral Oral  SpO2: 96%   98%  Weight:      Height:        General: Pt is alert, awake, not in acute distress Cardiovascular: RRR, S1/S2 +, no rubs, no gallops Respiratory: CTA bilaterally, no wheezing, no rhonchi Abdominal: Soft, NT, ND, bowel sounds + Extremities: no edema, no cyanosis    The results of significant diagnostics from this hospitalization (including imaging, microbiology, ancillary and laboratory) are listed below for reference.     Microbiology: No results found for this or any previous visit (from the past 240 hour(s)).   Labs: BNP (last 3 results) No results for input(s): BNP in the last 8760 hours. Basic Metabolic Panel: Recent Labs  Lab 07/25/21 2135 07/25/21 2339 07/26/21 0342 07/29/21 0453 07/30/21 0155  NA 139  --  139 138 138  K 3.1*  --  3.2* 3.7 3.5  CL 102  --  105 103 105  CO2 27  --  _0 GLUCOSE 143*  --  126* 118* 113*  BUN 25*  --  23* 15 17  CREATININE 1.12 1.20 1.23 1.16 1.19  CALCIUM 9.1  --  8.9 8.5* 8.9  MG  --  2.2 2.0  --   --   PHOS  --  3.3 4.4  --   --    Liver Function Tests: Recent Labs  Lab 07/26/21 0342  AST 23  ALT 31  ALKPHOS 68  BILITOT 0.3  PROT 6.4*  ALBUMIN 3.6   No results for input(s): LIPASE, AMYLASE in the last 168 hours. No results for input(s): AMMONIA in the last 168 hours. CBC: Recent Labs   Lab 07/25/21 2135 07/25/21 2339 07/26/21 0342 07/29/21 0453 07/30/21 0155  WBC 10.8* 10.6* 9.3 9.1 9.2  HGB 15.0 15.2 14.1 14.5 13.6  HCT 44.6 43.8 40.5 41.4 40.5  MCV 90.7 89.2 88.2 88.8 89.8  PLT 244 248 228 229 218   Cardiac Enzymes: No results for input(s): CKTOTAL, CKMB, CKMBINDEX, TROPONINI in the last 168 hours. BNP: Invalid input(s): POCBNP CBG: Recent Labs  Lab 07/29/21 0749 07/29/21 1150 07/29/21 1622 07/29/21 2107 07/30/21 0750  GLUCAP 117* 83 146* 164* 164*   D-Dimer No results for input(s): DDIMER in the last 72 hours. Hgb A1c No results for input(s): HGBA1C in the last 72 hours. Lipid Profile No results for input(s): CHOL, HDL, LDLCALC, TRIG, CHOLHDL, LDLDIRECT in the last 72 hours. Thyroid function studies No results for input(s): TSH, T4TOTAL, T3FREE, THYROIDAB in the last 72 hours.  Invalid input(s): FREET3 Anemia work up No results for input(s): VITAMINB12, FOLATE, FERRITIN, TIBC, IRON, RETICCTPCT in the last 72 hours. Urinalysis No results found for: COLORURINE, APPEARANCEUR, LABSPEC, [place], GLUCOSEU, HGBUR, BILIRUBINUR, KETONESUR, PROTEINUR, UROBILINOGEN, NITRITE, LEUKOCYTESUR Sepsis Labs Invalid input(s): PROCALCITONIN,  WBC,  LACTICIDVEN Microbiology No results found for this or any previous visit (from the past 240 hour(s)).  Please note: You were cared for by a hospitalist during your hospital stay. Once you are discharged, your primary care physician will handle any further medical issues. Please note that NO REFILLS for any discharge medications will be authorized once you are discharged, as it is imperative that you return to your primary care physician (or establish a relationship with a primary care physician if you do not have one) for your post hospital discharge needs so that they can reassess your need for medications and monitor your lab values.    Time coordinating discharge: 40 minutes  SIGNED:  Shelly Coss, MD  [facility] 07/30/2021, 10:39 AM Pager [phone]  If 7PM-7AM, please contact night-coverage [email or web address] Password [email or web address]

## 2021-07-30 NOTE — Progress Notes (Addendum)
Progress Note  Patient Name: Benjamin Small Date of Encounter: 07/30/2021  [place] Cardiologist: None   Subjective   No chest pain this morning.   Inpatient Medications    Scheduled Meds:  aspirin  324 mg Oral Once   aspirin EC  81 mg Oral Daily   carvedilol  25 mg Oral BID WC   empagliflozin  10 mg Oral Daily   enoxaparin (LOVENOX) injection  40 mg Subcutaneous Q24H   escitalopram  20 mg Oral Daily   furosemide  40 mg Intravenous BID   insulin aspart  0-5 Units Subcutaneous QHS   insulin aspart  0-9 Units Subcutaneous TID WC   levothyroxine  200 mcg Oral Q0600   losartan  50 mg Oral QHS   nicotine  21 mg Transdermal Daily   sodium chloride flush  3 mL Intravenous Q12H   sodium chloride flush  3 mL Intravenous Q12H   spironolactone  25 mg Oral QPM   tamsulosin  0.4 mg Oral QPM   Continuous Infusions:  sodium chloride     PRN Meds: sodium chloride, acetaminophen **OR** acetaminophen, albuterol, ALPRAZolam, hydrALAZINE, ondansetron **OR** ondansetron (ZOFRAN) IV, sodium chloride flush   Vital Signs    Vitals:   07/29/21 1342 07/29/21 1735 07/29/21 2008 07/30/21 0520  BP: (!) 162/106 (!) 147/93 (!) 172/108 (!) 140/95  Pulse: (!) 57   72  Resp: (!) 22     Temp: 97.7 F (36.5 C)  98.2 F (36.8 C) 98.7 F (37.1 C)  TempSrc: Oral  Oral Oral  SpO2: 96%   98%  Weight:      Height:        Intake/Output Summary (Last 24 hours) at 07/30/2021 0845 Last data filed at 07/29/2021 1821 Gross per 24 hour  Intake 231.85 ml  Output --  Net 231.85 ml      07/28/2021    7:20 PM 07/25/2021    9:18 PM 01/30/2019    7:37 AM  Last 3 Weights  Weight (lbs) 349 lb 13.9 oz 350 lb 325 lb  Weight (kg) 158.7 kg 158.759 kg 147.419 kg      Telemetry    Sinus Rhythm - Personally Reviewed  ECG    No new tracing  Physical Exam   GEN: Obese male, laying in bed   Neck: No JVD Cardiac: RRR, no murmurs, rubs, or gallops.  Respiratory: Clear to auscultation  bilaterally. GI: Soft, nontender, non-distended  MS: No edema; No deformity. Right radial cath site stable.  Neuro:  Nonfocal  Psych: Normal affect   Labs    High Sensitivity Troponin:   Recent Labs  Lab 07/25/21 2135 07/25/21 2339 07/26/21 0624  TROPONINIHS 35* 37* 33*     Chemistry Recent Labs  Lab 07/25/21 2339 07/26/21 0342 07/29/21 0453 07/30/21 0155  NA  --  139 138 138  K  --  3.2* 3.7 3.5  CL  --  105 103 105  CO2  --  28 26 28   GLUCOSE  --  126* 118* 113*  BUN  --  23* 15 17  CREATININE 1.20 1.23 1.16 1.19  CALCIUM  --  8.9 8.5* 8.9  MG 2.2 2.0  --   --   PROT  --  6.4*  --   --   ALBUMIN  --  3.6  --   --   AST  --  23  --   --   ALT  --  31  --   --  ALKPHOS  --  68  --   --   BILITOT  --  0.3  --   --   GFRNONAA >60 >60 >60 >60  ANIONGAP  --  6 9 5     Lipids  Recent Labs  Lab 07/26/21 0342  CHOL 150  TRIG 101  HDL 32*  LDLCALC 98  CHOLHDL 4.7    Hematology Recent Labs  Lab 07/26/21 0342 07/29/21 0453 07/30/21 0155  WBC 9.3 9.1 9.2  RBC 4.59 4.66 4.51  HGB 14.1 14.5 13.6  HCT 40.5 41.4 40.5  MCV 88.2 88.8 89.8  MCH 30.7 31.1 30.2  MCHC 34.8 35.0 33.6  RDW 13.6 13.2 13.2  PLT 228 229 218   Thyroid  Recent Labs  Lab 07/26/21 0342  TSH 6.446*    BNPNo results for input(s): BNP, PROBNP in the last 168 hours.  DDimer No results for input(s): DDIMER in the last 168 hours.   Radiology    CARDIAC CATHETERIZATION  Result Date: 07/29/2021 CONCLUSIONS: Widely patent coronary arteries with right dominant anatomy. 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 Recommend: Aggressive blood pressure control SGLT2 and Entresto.    Cardiac Studies   Cath: 07/29/21  CONCLUSIONS: Widely patent coronary arteries with right dominant anatomy. 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   Recommend:    Aggressive blood pressure control SGLT2 and Entresto.  Echo: 07/26/21  IMPRESSIONS     1. Left ventricular ejection fraction, by estimation, is 60 to 65%. The  left ventricle has normal function. The left ventricle has no regional  wall motion abnormalities. There is moderate left ventricular hypertrophy.  Left ventricular diastolic  parameters are consistent with Grade I diastolic dysfunction (impaired  relaxation).   2. Right ventricular systolic function is normal. The right ventricular  size is normal.   3. Left atrial size was mildly dilated.   4. The mitral valve is grossly normal. Trivial mitral valve  regurgitation.   5. The aortic valve was not well visualized. Aortic valve regurgitation  is not visualized. No aortic stenosis is present.   6. Aortic dilatation noted. There is borderline dilatation of the  ascending aorta, measuring 38 mm.   7. The inferior vena cava is dilated in size with >50% respiratory  variability, suggesting right atrial pressure of 8 mmHg.   Comparison(s): No prior Echocardiogram.   FINDINGS   Left Ventricle: Left ventricular ejection fraction, by estimation, is 60  to 65%. The left ventricle has normal function. The left ventricle has no  regional wall motion abnormalities. Definity contrast agent was given IV  to delineate the left ventricular   endocardial borders. The left ventricular internal cavity size was normal  in size. There is moderate left ventricular hypertrophy. Left ventricular  diastolic parameters are consistent with Grade I diastolic dysfunction  (impaired relaxation).  Indeterminate filling pressures.   Right Ventricle: The right ventricular size is normal. No increase in  right ventricular wall thickness. Right ventricular systolic function is  normal.   Left Atrium: Left atrial size was mildly dilated.   Right Atrium: Right atrial size was normal in size.   Pericardium: There is no evidence of pericardial effusion.    Mitral Valve: The mitral valve is grossly normal. Trivial mitral valve  regurgitation. MV peak gradient, 5.9 mmHg. The mean mitral valve gradient  is 1.0 mmHg.   Tricuspid Valve: The  tricuspid valve is grossly normal. Tricuspid valve  regurgitation is trivial.   Aortic Valve: The aortic valve was not well visualized. Aortic valve  regurgitation is not visualized. No aortic stenosis is present. Aortic  valve mean gradient measures 4.0 mmHg. Aortic valve peak gradient measures  9.0 mmHg. Aortic valve area, by VTI  measures 3.38 cm.   Pulmonic Valve: The pulmonic valve was grossly normal. Pulmonic valve  regurgitation is trivial.   Aorta: Aortic dilatation noted. There is borderline dilatation of the  ascending aorta, measuring 38 mm.   Venous: The inferior vena cava is dilated in size with greater than 50%  respiratory variability, suggesting right atrial pressure of 8 mmHg.   IAS/Shunts: No atrial level shunt detected by color flow Doppler.       Patient Profile     51 y.o. male with history of hypertension, DM, OSA and smoking who presented with chest pain.   Assessment & Plan    Chest pain: negative hsTn. EKG showed TWI in inferolateral leads. Echo showed LVEF of 60-65% with no rWMA. Underwent cardiac cath yesterday with no significant CAD.  -- continue ASA, statin, Coreg 25mg  BID, losartan 50mg  BID, spiro 25mg  daily   HTN: Blood pressures remain elevated -- continue coreg 25mg  BID, increase to losartan 50mg  BID, spiro 25mg  daily  HFpEF: LVEF of 60-65% with g1DD, LVEDP 37 on cath. Has received several doses of IV lasix. On lasix 20mg  daily PTA, would increase to 40mg  PO daily at discharge    HLD: LDL 98 -- continue atorvastatin 20mg  daily   DM: Hgb A1c 6.5 -- on SSI -- now on jardiance    Hypothyroidism: TSH 6.4 -- on synthroid 200 mcg daily    OSA: on Cpap   Tobacco use: on nicotine patch -- cessation advised   [facility] will sign off.   Medication  Recommendations:  noted above  Other recommendations (labs, testing, etc):  none Follow up as an outpatient:  follow up with Dr. Claudie Leach with [facility] medical cardiology  For questions or updates, please contact [place] Please consult [email or web address] for contact info under        Signed, Reino Bellis, NP  07/30/2021, 8:45 AM     ATTENDING ATTESTATION:  After conducting a review of all available clinical information with the care team, interviewing the patient, and performing a physical exam, I agree with the findings and plan described in this note.   GEN: No acute distress.   HEENT:  MMM, no JVD, no scleral icterus Cardiac: RRR, no murmurs, rubs, or gallops.  Respiratory: Clear to auscultation bilaterally. GI: Soft, nontender, non-distended  MS: No edema; No deformity. Neuro:  Nonfocal  Vasc:  +2 radial pulses  Patient doing well.  On GDMT for acute diastolic HF including jardiance, losartan, coreg, and spironolactone. Cont lasix 40 PO Qday.  Discharge today with cardiology follow up.  Lenna Sciara, MD Pager [phone]

## 2021-07-30 NOTE — Progress Notes (Signed)
Heart Failure Stewardship Pharmacist Progress Note   PCP: Pcp, No PCP-Cardiologist: None    HPI:  51 yo M with PMH of HTN, T2DM, OSA, and smoking. He presented to the ED on 5/26 with chest pain, shortness of breath, and nausea. He recently went to his PCP office earlier and SBP 220s. He went to OSH the day prior to arrival and left AMA after dissection ruled out. CXR this admission with no active cardiopulmonary disease. An ECHO was done on 5/27 and LVEF 60-65% with G1DD and moderate LVH. LHC on 5/30 patent coronary arteries and elevated LVEDP.  Discharge HF Medications: Diuretic: furosemide 40 mg daily Beta Blocker: carvedilol 25 mg BID ACE/ARB/ARNI: losartan 50 mg BID Aldosterone Antagonist: spironolactone 25 mg daily SGLT2i: Jardiance 10 mg daily  Prior to admission HF Medications: Diuretic: furosemide 20 mg daily Beta blocker: carvedilol 25 mg BID ACE/ARB/ARNI: lisinopril 40 mg daily Aldosterone Antagonist: spironolactone 25 mg daily  Pertinent Lab Values: Serum creatinine 1.19, BUN 17, Potassium 3.5, Sodium 138, Magnesium 2.0, A1c 6.5   Vital Signs: Weight: 349 lbs (admission weight: 350 lbs) Blood pressure: 140-160/90s  Heart rate: 50-60s  I/O: not recorded  Medication Assistance / Insurance Benefits Check: Does the patient have prescription insurance?  Yes Type of insurance plan: [place] Medicaid  Outpatient Pharmacy:  Prior to admission outpatient pharmacy: [facility] Is the patient willing to use [facility] TOC pharmacy at discharge? Yes Is the patient willing to transition their outpatient pharmacy to utilize a [facility] outpatient pharmacy?   Pending    Assessment: 1. Acute on chronic diastolic CHF (LVEF 60-65%%), due to NICM. NYHA class II symptoms. - Continue carvedilol 25 mg BID - On losartan 50 mg BID - consider optimizing to Entresto 49/51 mg BID - Continue spironolactone 25 mg daily - Continue Jardiance 10 mg daily   Plan: 1) Medication changes recommended at this  time: - Stop losartan - Start Entresto 49/51 mg BID  2) Patient assistance: - Jardiance copay $4 - Entresto requires prior authorization  3)  Education  - Patient has been educated on current HF medications and potential additions to HF medication regimen - Patient verbalizes understanding that over the next few months, these medication doses may change and more medications may be added to optimize HF regimen - Patient has been educated on basic disease state pathophysiology and goals of therapy   Sharen Hones, PharmD, BCPS Heart Failure Engineer, building services Phone [phone]

## 2021-07-31 LAB — LIPOPROTEIN A (LPA): Lipoprotein (a): 43 nmol/L — ABNORMAL HIGH (ref <75.0)

## 2021-11-07 ENCOUNTER — Emergency Department (HOSPITAL_COMMUNITY)
Admission: EM | Admit: 2021-11-07 | Discharge: 2021-11-07 | Disposition: A | Discharge status: Home / Self Care | Payer: Medicaid Other | Attending: Emergency Medicine | Admitting: Emergency Medicine

## 2021-11-07 ENCOUNTER — Emergency Department (HOSPITAL_COMMUNITY): Payer: Medicaid Other

## 2021-11-07 ENCOUNTER — Encounter (HOSPITAL_COMMUNITY): Payer: Self-pay | Admitting: Emergency Medicine

## 2021-11-07 DIAGNOSIS — M545 Low back pain, unspecified: Secondary | ICD-10-CM | POA: Diagnosis not present

## 2021-11-07 DIAGNOSIS — R0602 Shortness of breath: Secondary | ICD-10-CM | POA: Insufficient documentation

## 2021-11-07 DIAGNOSIS — R1012 Left upper quadrant pain: Secondary | ICD-10-CM

## 2021-11-07 DIAGNOSIS — R110 Nausea: Secondary | ICD-10-CM | POA: Insufficient documentation

## 2021-11-07 DIAGNOSIS — R222 Localized swelling, mass and lump, trunk: Secondary | ICD-10-CM | POA: Insufficient documentation

## 2021-11-07 DIAGNOSIS — R1032 Left lower quadrant pain: Secondary | ICD-10-CM | POA: Insufficient documentation

## 2021-11-07 LAB — CBC
HCT: 45.2 % (ref 39.0–52.0)
Hemoglobin: 15.5 g/dL (ref 13.0–17.0)
MCH: 30.7 pg (ref 26.0–34.0)
MCHC: 34.3 g/dL (ref 30.0–36.0)
MCV: 89.5 fL (ref 80.0–100.0)
Platelets: 254 10*3/uL (ref 150–400)
RBC: 5.05 MIL/uL (ref 4.22–5.81)
RDW: 13.8 % (ref 11.5–15.5)
WBC: 11.5 10*3/uL — ABNORMAL HIGH (ref 4.0–10.5)
nRBC: 0 % (ref 0.0–0.2)

## 2021-11-07 LAB — COMPREHENSIVE METABOLIC PANEL
ALT: 37 U/L (ref 0–44)
AST: 31 U/L (ref 15–41)
Albumin: 4.1 g/dL (ref 3.5–5.0)
Alkaline Phosphatase: 74 U/L (ref 38–126)
Anion gap: 10 (ref 5–15)
BUN: 12 mg/dL (ref 6–20)
CO2: 29 mmol/L (ref 22–32)
Calcium: 9.3 mg/dL (ref 8.9–10.3)
Chloride: 102 mmol/L (ref 98–111)
Creatinine, Ser: 1.03 mg/dL (ref 0.61–1.24)
GFR, Estimated: >60 mL/min (ref >60)
Glucose, Bld: 123 mg/dL — ABNORMAL HIGH (ref 70–99)
Potassium: 3.5 mmol/L (ref 3.5–5.1)
Sodium: 141 mmol/L (ref 135–145)
Total Bilirubin: 0.6 mg/dL (ref 0.3–1.2)
Total Protein: 7.4 g/dL (ref 6.5–8.1)

## 2021-11-07 LAB — URINALYSIS, ROUTINE W REFLEX MICROSCOPIC
Bacteria, UA: NONE SEEN
Bilirubin Urine: NEGATIVE
Glucose, UA: NEGATIVE mg/dL
Hgb urine dipstick: NEGATIVE
Ketones, ur: NEGATIVE mg/dL
Leukocytes,Ua: NEGATIVE
Nitrite: NEGATIVE
Protein, ur: 30 mg/dL — AB
Specific Gravity, Urine: 1.015 (ref 1.005–1.030)
pH: 6 (ref 5.0–8.0)

## 2021-11-07 LAB — LIPASE, BLOOD: Lipase: 34 U/L (ref 11–51)

## 2021-11-07 LAB — TROPONIN I (HIGH SENSITIVITY)
Troponin I (High Sensitivity): 55 ng/L — ABNORMAL HIGH (ref <18)
Troponin I (High Sensitivity): 56 ng/L — ABNORMAL HIGH (ref <18)

## 2021-11-07 LAB — BRAIN NATRIURETIC PEPTIDE: B Natriuretic Peptide: 175.8 pg/mL — ABNORMAL HIGH (ref 0.0–100.0)

## 2021-11-07 MED ORDER — DICLOFENAC SODIUM 1 % EX GEL: 4.0000 g | Freq: Four times a day (QID) | CUTANEOUS | 0 refills | Status: DC

## 2021-11-07 MED ORDER — ONDANSETRON 4 MG PO TBDP: ORAL_TABLET | ORAL | 0 refills | Status: DC

## 2021-11-07 MED ORDER — MORPHINE SULFATE 15 MG PO TABS: 7.5000 mg | ORAL_TABLET | ORAL | 0 refills | Status: DC | PRN

## 2021-11-07 MED ORDER — IOHEXOL 300 MG/ML  SOLN
100.0000 mL | Freq: Once | INTRAMUSCULAR | Status: AC | PRN
  Administered 2021-11-07: 100 mL via INTRAVENOUS

## 2021-11-07 NOTE — ED Provider Notes (Signed)
[facility] EMERGENCY DEPARTMENT Provider Note   CSN: [id number] Arrival date & time: 11/07/21  1253     History  Chief Complaint  Patient presents with   Abdominal Pain    Benjamin Small is a 51 y.o. male.  51 yo M with a cc of LLQ abdominal pain.  Started abruptly this morning. Severe excrutiating with swelling.  Nausea without vomiting. No fevers.  No trauma.  No diarrhea. Had some low back pain for the past few day.    Abdominal Pain      Home Medications Prior to Admission medications   Medication Sig Start Date End Date Taking? Authorizing Provider  diclofenac Sodium (VOLTAREN) 1 % GEL Apply 4 g topically 4 (four) times daily. 11/07/21  Yes Deno Etienne, DO  morphine (MSIR) 15 MG tablet Take 0.5 tablets (7.5 mg total) by mouth every 4 (four) hours as needed for severe pain. 11/07/21  Yes Deno Etienne, DO  ondansetron (ZOFRAN-ODT) 4 MG disintegrating tablet 83m ODT q4 hours prn nausea/vomit 11/07/21  Yes FDeno Etienne DO  ALPRAZolam (Duanne Moron 1 MG tablet Take 1 mg by mouth 3 (three) times daily as needed for anxiety.    [provider]  amLODipine (NORVASC) 10 MG tablet Take 10 mg by mouth every evening.    [provider]  amphetamine-dextroamphetamine (ADDERALL XR) 30 MG 24 hr capsule Take 60 mg by mouth daily. 07/23/21   [provider]  atorvastatin (LIPITOR) 20 MG tablet Take 1 tablet (20 mg total) by mouth daily. 07/31/21   AShelly Coss MD  blood glucose meter kit and supplies Dispense based on patient and insurance preference. Use up to four times daily as directed. (FOR ICD-10 E10.9, E11.9). 07/30/21   AShelly Coss MD  carvedilol (COREG) 25 MG tablet Take 1 tablet (25 mg total) by mouth 2 (two) times daily with a meal. 12/25/14   RSkeet Latch MD  empagliflozin (JARDIANCE) 10 MG TABS tablet Take 1 tablet (10 mg total) by mouth daily. 07/31/21   AShelly Coss MD  escitalopram (LEXAPRO) 20 MG tablet Take 20 mg by mouth daily.  05/06/21   [provider]  fluticasone (FLONASE) 50 MCG/ACT nasal spray Place 1 spray into both nostrils daily as needed for allergies.     [provider]  furosemide (LASIX) 40 MG tablet Take 1 tablet (40 mg total) by mouth every evening. 07/30/21 08/29/21  AShelly Coss MD  ibuprofen (ADVIL) 200 MG tablet Take 800 mg by mouth every 6 (six) hours as needed for headache or moderate pain.    [provider]  Insulin Pen Needle 32G X 4 MM MISC 1 each by Does not apply route 2 (two) times daily. 07/30/21   AShelly Coss MD  levothyroxine (SYNTHROID) 200 MCG tablet Take 200 mcg by mouth daily. 07/22/20   [provider]  loratadine (CLARITIN) 10 MG tablet Take 10 mg by mouth daily as needed for allergies.     [provider]  losartan (COZAAR) 50 MG tablet Take 1 tablet (50 mg total) by mouth 2 (two) times daily. 07/30/21   AShelly Coss MD  nicotine (NICODERM CQ - DOSED IN MG/24 HOURS) 21 mg/24hr patch 21 mg daily. 05/06/21   [provider]  NON FORMULARY CPAP at bedtime    [provider]  Semaglutide,0.25 or 0.5MG/DOS, 2 MG/3ML SOPN Inject 0.25 mg into the skin once a week. 07/30/21   AShelly Coss MD  spironolactone (ALDACTONE) 25 MG tablet Take 25 mg by  mouth every evening. 07/23/21   [provider]  Tadalafil (CIALIS) 2.5 MG TABS Take 1 tablet (2.5 mg total) by mouth daily as needed (Erectile Dysfunction). 12/25/14   Skeet Latch, MD  tamsulosin (FLOMAX) 0.4 MG CAPS capsule Take 0.4 mg by mouth every evening. 05/06/21   [provider]  testosterone cypionate (DEPOTESTOSTERONE CYPIONATE) 200 MG/ML injection Inject 0.75 mLs into the muscle every Sunday. 05/08/21   [provider]  traMADol (ULTRAM-ER) 100 MG 24 hr tablet Take 100 mg by mouth daily as needed for pain. 06/20/21   [provider]  Vitamin D, Ergocalciferol, (DRISDOL) 1.25 MG (50000 UNIT) CAPS capsule Take 50,000 Units by mouth every  Sunday. 06/20/21   [provider]      Allergies    Patient has no known allergies.    Review of Systems   Review of Systems  Gastrointestinal:  Positive for abdominal pain.    Physical Exam Updated Vital Signs BP (!) 198/120 (BP Location: Left Arm)   Pulse 95   Temp 98.3 F (36.8 C) (Oral)   Resp 15   SpO2 96%  Physical Exam Vitals and nursing note reviewed.  Constitutional:      Appearance: He is well-developed.  HENT:     Head: Normocephalic and atraumatic.  Eyes:     Pupils: Pupils are equal, round, and reactive to light.  Neck:     Vascular: No JVD.  Cardiovascular:     Rate and Rhythm: Normal rate and regular rhythm.     Heart sounds: No murmur heard.    No friction rub. No gallop.  Pulmonary:     Effort: No respiratory distress.     Breath sounds: No wheezing.  Abdominal:     General: There is no distension.     Tenderness: There is abdominal tenderness. There is no guarding or rebound.     Comments: Pain and swelling over the LLQ. No erythema, no warmth.  Old abdominal scar.  No break in the skin.  Musculoskeletal:        General: Normal range of motion.     Cervical back: Normal range of motion and neck supple.  Skin:    Coloration: Skin is not pale.     Findings: No rash.  Neurological:     Mental Status: He is alert and oriented to person, place, and time.  Psychiatric:        Behavior: Behavior normal.     ED Results / Procedures / Treatments   Labs (all labs ordered are listed, but only abnormal results are displayed) Labs Reviewed  COMPREHENSIVE METABOLIC PANEL - Abnormal; Notable for the following components:      Result Value   Glucose, Bld 123 (*)    All other components within normal limits  CBC - Abnormal; Notable for the following components:   WBC 11.5 (*)    All other components within normal limits  URINALYSIS, ROUTINE W REFLEX MICROSCOPIC - Abnormal; Notable for the following components:   Protein, ur 30 (*)    All  other components within normal limits  BRAIN NATRIURETIC PEPTIDE - Abnormal; Notable for the following components:   B Natriuretic Peptide 175.8 (*)    All other components within normal limits  TROPONIN I (HIGH SENSITIVITY) - Abnormal; Notable for the following components:   Troponin I (High Sensitivity) 55 (*)    All other components within normal limits  TROPONIN I (HIGH SENSITIVITY) - Abnormal; Notable for the following components:  Troponin I (High Sensitivity) 56 (*)    All other components within normal limits  LIPASE, BLOOD    EKG None  Radiology CT Abdomen Pelvis W Contrast  Result Date: 11/07/2021 CLINICAL DATA:  Left lower quadrant abdominal pain. EXAM: CT ABDOMEN AND PELVIS WITH CONTRAST TECHNIQUE: Multidetector CT imaging of the abdomen and pelvis was performed using the standard protocol following bolus administration of intravenous contrast. RADIATION DOSE REDUCTION: This exam was performed according to the departmental dose-optimization program which includes automated exposure control, adjustment of the mA and/or kV according to patient size and/or use of iterative reconstruction technique. CONTRAST:  167m OMNIPAQUE IOHEXOL 300 MG/ML  SOLN COMPARISON:  CT of the abdomen and pelvis 07/23/2021. FINDINGS: Lower chest: The lung bases are clear without focal nodule, mass, or airspace disease. Heart size is normal. No significant pleural or pericardial effusion is present. Hepatobiliary: No focal liver abnormality is seen. Status post cholecystectomy. No biliary dilatation. Pancreas: Unremarkable. No pancreatic ductal dilatation or surrounding inflammatory changes. Spleen: Normal in size without focal abnormality. Adrenals/Urinary Tract: Adrenal glands are normal bilaterally. No stone or mass lesion is present. Ureters are within normal limits. The urinary bladder is collapsed. Stomach/Bowel: Stomach and duodenum are within normal limits. Small bowel is unremarkable. The terminal ileum  is within normal limits. The appendix is visualized and normal. The ascending transverse colon are within normal limits. Descending and sigmoid colon are normal. Vascular/Lymphatic: Minimal atherosclerotic calcifications are present in the aorta and iliac vessels without aneurysm or stenosis. Reproductive: Prostate is unremarkable. Other: No abdominal wall hernia or abnormality. No abdominopelvic ascites. Musculoskeletal: Asymmetry of the rectus abdominus muscles noted. Some inflammatory changes noted about the left rectus abdominus. No definite hemorrhage is present. IMPRESSION: 1. Asymmetry of the rectus abdominus muscles with some inflammatory changes about the left rectus abdominus muscle. This may represent a muscle strain. No definite hemorrhage is present. Question trauma. 2. No other acute or focal lesion to explain the patient's left lower quadrant abdominal pain. Descending and sigmoid colon are normal. 3. Cholecystectomy. 4. Aortic Atherosclerosis (ICD10-I70.0). Electronically Signed   By: CSan MorelleM.D.   On: 11/07/2021 18:36   DG Chest 2 View  Result Date: 11/07/2021 CLINICAL DATA:  Shortness of breath. Abdominal swelling. Nausea for 1 day. EXAM: CHEST - 2 VIEW COMPARISON:  07/25/2021 FINDINGS: Borderline enlargement of the cardiopericardial silhouette. Atherosclerotic calcification of the aortic arch. The lungs appear clear. No blunting of the costophrenic angles. Upper thoracic spondylosis. IMPRESSION: 1. Borderline enlargement of the cardiopericardial silhouette, without edema. 2.  Aortic Atherosclerosis (ICD10-I70.0). Electronically Signed   By: WVan ClinesM.D.   On: 11/07/2021 14:00    Procedures Procedures    Medications Ordered in ED Medications  iohexol (OMNIPAQUE) 300 MG/ML solution 100 mL (100 mLs Intravenous Contrast Given 11/07/21 1810)    ED Course/ Medical Decision Making/ A&P                           Medical Decision Making Amount and/or Complexity of  Data Reviewed Labs: ordered.  Risk Prescription drug management.   51yo M with a cc of LLQ abdominal pain.  Started abruptly this morning.  Nausea, no fevers.    Hx of heart failure.  MSE process in triage with delta trop, negative, no significant electrolyte abnormality, bnp <200.  UA negative for infection.  CT with left rectus muscle enlargement.  No obvious intra-abdominal pathology.  I discussed the results with the  patient and his wife who were somewhat reluctant to believe that he could have pulled a muscle.  They requested further imaging and I had a discussion with them about how I did not feel any further imaging would be beneficial in the ER setting.    I discussed other possible pathologies.  As the patient is doing reasonably well recommended follow-up with PCP and will return for worsening.  7:11 PM:  I have discussed the diagnosis/risks/treatment options with the patient and family.  Evaluation and diagnostic testing in the emergency department does not suggest an emergent condition requiring admission or immediate intervention beyond what has been performed at this time.  They will follow up with  PCP. We also discussed returning to the ED immediately if new or worsening sx occur. We discussed the sx which are most concerning (e.g., sudden worsening pain, fever, inability to tolerate by mouth) that necessitate immediate return. Medications administered to the patient during their visit and any new prescriptions provided to the patient are listed below.  Medications given during this visit Medications  iohexol (OMNIPAQUE) 300 MG/ML solution 100 mL (100 mLs Intravenous Contrast Given 11/07/21 1810)     The patient appears reasonably screen and/or stabilized for discharge and I doubt any other medical condition or other [facility] requiring further screening, evaluation, or treatment in the ED at this time prior to discharge.          Final Clinical Impression(s) / ED  Diagnoses Final diagnoses:  Left upper quadrant abdominal pain    Rx / DC Orders ED Discharge Orders          Ordered    diclofenac Sodium (VOLTAREN) 1 % GEL  4 times daily        11/07/21 1857    morphine (MSIR) 15 MG tablet  Every 4 hours PRN        11/07/21 1857    ondansetron (ZOFRAN-ODT) 4 MG disintegrating tablet        11/07/21 1857              Deno Etienne, DO 11/07/21 1911

## 2021-11-07 NOTE — ED Provider Triage Note (Signed)
Emergency Medicine Provider Triage Evaluation Note  Benjamin Small , a 51 y.o. male  was evaluated in triage.  Pt complains of abdominal pain.  Patient states that symptoms began approximately 3 to 4 days ago with bilateral flank pain.  He states that symptoms then progressed to this morning of left lower quadrant abdominal pain.  Denies history of symptoms similar.  He does feel more distended than normal.  He also secondarily endorses shortness of breath worsening over the past 3 to 4 weeks.  He has a history of heart failure and states he has been taking his at home medications as normal.  Denies fever, chills, night sweats, chest pain, vomiting, urinary symptoms, change in bowel habits, medic easier, melena.  Review of Systems  Positive: See above Negative:   Physical Exam  BP (!) 195/123 (BP Location: Right Arm)   Pulse 96   Temp 98.6 F (37 C) (Oral)   Resp 20   SpO2 97%  Gen:   Awake, no distress   Resp:  Normal effort  MSK:   Moves extremities without difficulty  Other:  Left abdominal tenderness to palpation.  No overlying skin abnormalities noted.  Patient's abdomen diffusely distended.  2-3+ bilateral lower extremity edema noted.  Decreased lung sounds bilaterally.  Patient tachycardic on exam with normal rate and rhythm.  Medical Decision Making  Medically screening exam initiated at 1:38 PM.  Appropriate orders placed.  Lowella Fairy was informed that the remainder of the evaluation will be completed by another provider, this initial triage assessment does not replace that evaluation, and the importance of remaining in the ED until their evaluation is complete.     Peter Garter, [place] 11/07/21 1340

## 2021-11-07 NOTE — ED Triage Notes (Signed)
Patient here with complaint of LLQ abdominal pain that started this morning and has gotten progressively worse. Patient denies vomiting and diarrhea. Patient also reports bilateral flank pain.

## 2021-11-07 NOTE — ED Notes (Signed)
Pt denies cp, dizziness, nausea. Pt reports feeling sob, hx of CHF and sometimes misses his fluid pill. Pt reports LLQ abd pain, constant and radiates around to L flank.

## 2021-11-07 NOTE — Discharge Instructions (Signed)
Your CT scan would suggest that you have an injury to your abdominal wall musculature.  Please follow-up with your family doctor in the office.  I have you call them first thing on Monday and discuss your visit here and see when they want to see you in the office.  Please return for worsening pain fever or inability to eat or drink.  Use the gel as prescribed Also take tylenol 1000mg (2 extra strength) four times a day.   Then take the pain medicine if you feel like you need it. Narcotics do not help with the pain, they only make you care about it less.  You can become addicted to this, people may break into your house to steal it.  It will constipate you.  If you drive under the influence of this medicine you can get a DUI.

## 2021-11-07 NOTE — ED Notes (Signed)
Patient transported to CT 

## 2022-04-17 ENCOUNTER — Inpatient Hospital Stay (HOSPITAL_COMMUNITY)
Admission: EM | Admit: 2022-04-17 | Discharge: 2022-04-20 | DRG: 291 | Disposition: A | Discharge status: Home / Self Care | Payer: BC Managed Care – PPO | Attending: Internal Medicine | Admitting: Internal Medicine

## 2022-04-17 ENCOUNTER — Encounter (HOSPITAL_COMMUNITY): Payer: Self-pay

## 2022-04-17 ENCOUNTER — Emergency Department (HOSPITAL_COMMUNITY): Payer: BC Managed Care – PPO

## 2022-04-17 DIAGNOSIS — I878 Other specified disorders of veins: Secondary | ICD-10-CM | POA: Diagnosis present

## 2022-04-17 DIAGNOSIS — F4310 Post-traumatic stress disorder, unspecified: Secondary | ICD-10-CM | POA: Diagnosis present

## 2022-04-17 DIAGNOSIS — F909 Attention-deficit hyperactivity disorder, unspecified type: Secondary | ICD-10-CM | POA: Diagnosis present

## 2022-04-17 DIAGNOSIS — Z79899 Other long term (current) drug therapy: Secondary | ICD-10-CM

## 2022-04-17 DIAGNOSIS — I160 Hypertensive urgency: Secondary | ICD-10-CM | POA: Diagnosis present

## 2022-04-17 DIAGNOSIS — Z91199 Patient's noncompliance with other medical treatment and regimen due to unspecified reason: Secondary | ICD-10-CM

## 2022-04-17 DIAGNOSIS — E119 Type 2 diabetes mellitus without complications: Secondary | ICD-10-CM

## 2022-04-17 DIAGNOSIS — I509 Heart failure, unspecified: Principal | ICD-10-CM

## 2022-04-17 DIAGNOSIS — R0602 Shortness of breath: Secondary | ICD-10-CM | POA: Diagnosis not present

## 2022-04-17 DIAGNOSIS — I10 Essential (primary) hypertension: Secondary | ICD-10-CM | POA: Diagnosis present

## 2022-04-17 DIAGNOSIS — I5033 Acute on chronic diastolic (congestive) heart failure: Secondary | ICD-10-CM | POA: Diagnosis present

## 2022-04-17 DIAGNOSIS — Z79890 Hormone replacement therapy (postmenopausal): Secondary | ICD-10-CM

## 2022-04-17 DIAGNOSIS — J101 Influenza due to other identified influenza virus with other respiratory manifestations: Secondary | ICD-10-CM | POA: Diagnosis present

## 2022-04-17 DIAGNOSIS — G4733 Obstructive sleep apnea (adult) (pediatric): Secondary | ICD-10-CM | POA: Diagnosis present

## 2022-04-17 DIAGNOSIS — E039 Hypothyroidism, unspecified: Secondary | ICD-10-CM | POA: Diagnosis present

## 2022-04-17 DIAGNOSIS — J449 Chronic obstructive pulmonary disease, unspecified: Secondary | ICD-10-CM | POA: Diagnosis present

## 2022-04-17 DIAGNOSIS — N400 Enlarged prostate without lower urinary tract symptoms: Secondary | ICD-10-CM | POA: Diagnosis present

## 2022-04-17 DIAGNOSIS — Z803 Family history of malignant neoplasm of breast: Secondary | ICD-10-CM

## 2022-04-17 DIAGNOSIS — R051 Acute cough: Secondary | ICD-10-CM

## 2022-04-17 DIAGNOSIS — I110 Hypertensive heart disease with heart failure: Principal | ICD-10-CM | POA: Diagnosis present

## 2022-04-17 DIAGNOSIS — Z811 Family history of alcohol abuse and dependence: Secondary | ICD-10-CM

## 2022-04-17 DIAGNOSIS — E785 Hyperlipidemia, unspecified: Secondary | ICD-10-CM | POA: Diagnosis present

## 2022-04-17 DIAGNOSIS — E876 Hypokalemia: Secondary | ICD-10-CM | POA: Diagnosis present

## 2022-04-17 DIAGNOSIS — F17210 Nicotine dependence, cigarettes, uncomplicated: Secondary | ICD-10-CM | POA: Diagnosis present

## 2022-04-17 DIAGNOSIS — Z6841 Body Mass Index (BMI) 40.0 and over, adult: Secondary | ICD-10-CM

## 2022-04-17 DIAGNOSIS — E1165 Type 2 diabetes mellitus with hyperglycemia: Secondary | ICD-10-CM

## 2022-04-17 LAB — BASIC METABOLIC PANEL
Anion gap: 12 (ref 5–15)
BUN: 14 mg/dL (ref 6–20)
CO2: 29 mmol/L (ref 22–32)
Calcium: 9.4 mg/dL (ref 8.9–10.3)
Chloride: 100 mmol/L (ref 98–111)
Creatinine, Ser: 1.19 mg/dL (ref 0.61–1.24)
GFR, Estimated: >60 mL/min (ref >60)
Glucose, Bld: 171 mg/dL — ABNORMAL HIGH (ref 70–99)
Potassium: 3 mmol/L — ABNORMAL LOW (ref 3.5–5.1)
Sodium: 141 mmol/L (ref 135–145)

## 2022-04-17 LAB — BRAIN NATRIURETIC PEPTIDE: B Natriuretic Peptide: 322.2 pg/mL — ABNORMAL HIGH (ref 0.0–100.0)

## 2022-04-17 LAB — CBC
HCT: 44.6 % (ref 39.0–52.0)
Hemoglobin: 15.5 g/dL (ref 13.0–17.0)
MCH: 30.5 pg (ref 26.0–34.0)
MCHC: 34.8 g/dL (ref 30.0–36.0)
MCV: 87.8 fL (ref 80.0–100.0)
Platelets: 260 10*3/uL (ref 150–400)
RBC: 5.08 MIL/uL (ref 4.22–5.81)
RDW: 14 % (ref 11.5–15.5)
WBC: 11.6 10*3/uL — ABNORMAL HIGH (ref 4.0–10.5)
nRBC: 0 % (ref 0.0–0.2)

## 2022-04-17 LAB — TROPONIN I (HIGH SENSITIVITY)
Troponin I (High Sensitivity): 96 ng/L — ABNORMAL HIGH (ref <18)
Troponin I (High Sensitivity): 90 ng/L — ABNORMAL HIGH (ref <18)

## 2022-04-17 MED ORDER — LOSARTAN POTASSIUM 50 MG PO TABS
50.0000 mg | ORAL_TABLET | Freq: Once | ORAL | Status: AC
  Administered 2022-04-17: 50 mg via ORAL
  Filled 2022-04-17: qty 1

## 2022-04-17 MED ORDER — POTASSIUM CHLORIDE CRYS ER 20 MEQ PO TBCR
40.0000 meq | EXTENDED_RELEASE_TABLET | Freq: Once | ORAL | Status: AC
  Administered 2022-04-17: 40 meq via ORAL
  Filled 2022-04-17: qty 2

## 2022-04-17 MED ORDER — AMLODIPINE BESYLATE 5 MG PO TABS
10.0000 mg | ORAL_TABLET | Freq: Once | ORAL | Status: AC
  Administered 2022-04-17: 10 mg via ORAL
  Filled 2022-04-17: qty 2

## 2022-04-17 MED ORDER — DIPHENHYDRAMINE HCL 50 MG/ML IJ SOLN
25.0000 mg | Freq: Once | INTRAMUSCULAR | Status: AC
  Administered 2022-04-17: 25 mg via INTRAVENOUS
  Filled 2022-04-17: qty 1

## 2022-04-17 MED ORDER — OSELTAMIVIR PHOSPHATE 75 MG PO CAPS: 75.0000 mg | ORAL_CAPSULE | Freq: Once | ORAL | Status: DC

## 2022-04-17 MED ORDER — BENZONATATE 100 MG PO CAPS
100.0000 mg | ORAL_CAPSULE | Freq: Once | ORAL | Status: AC
  Administered 2022-04-17: 100 mg via ORAL
  Filled 2022-04-17: qty 1

## 2022-04-17 MED ORDER — FUROSEMIDE 10 MG/ML IJ SOLN
80.0000 mg | Freq: Once | INTRAMUSCULAR | Status: AC
  Administered 2022-04-17: 80 mg via INTRAVENOUS
  Filled 2022-04-17: qty 8

## 2022-04-17 MED ORDER — HYDRALAZINE HCL 20 MG/ML IJ SOLN
10.0000 mg | Freq: Once | INTRAMUSCULAR | Status: AC
  Administered 2022-04-17: 10 mg via INTRAVENOUS
  Filled 2022-04-17: qty 1

## 2022-04-17 NOTE — ED Provider Notes (Cosign Needed Addendum)
[facility] Provider Note   CSN: [id number] Arrival date & time: 04/17/22  1850     History  Chief Complaint  Patient presents with   Congestive Heart Failure    Benjamin Small is a 52 y.o. male.  Patient presents the emergency department after being evaluated at an urgent care and [place].  Patient was noted to have a blood pressure of 236/150 at urgent care.  Urgent care stated that the patient had "extra fluid".  The patient states that his lower extremities were so swollen that yesterday after work he had to cut the right boot off of his foot in order to get his foot out.  The patient has been prescribed multiple medications including multiple blood pressure medications, diuretics, diabetic medications, COPD medications.  He states he has taken none of these medications since the beginning of November.  He states that his Medicaid ran out at that time.  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  Currently patient complains of shortness of breath, worse with lying down.  He complains of significant swelling of the lower extremities.  He complains of feeling tired.  He was also diagnosed yesterday with influenza B.  The patient denies chest pain, abdominal pain, nausea, vomiting, headache.  The patient also has a history of BPH and states that he has been having a difficult time with urination and has been off of his Flomax.  Past medical history significant for hypertension, OSA, obesity, type II DM, diastolic heart failure, COPD, anxiety disorder, Hashimoto's disease  HPI     Home Medications Prior to Admission medications   Medication Sig Start Date End Date Taking? Authorizing Provider  albuterol (PROVENTIL) (2.5 MG/3ML) 0.083% nebulizer solution Take 2.5 mg by nebulization every 6 (six) hours as needed for wheezing or shortness of breath.  12/04/21  Yes [provider]  ALPRAZolam Duanne Moron) 1 MG tablet Take 1 mg by mouth daily as needed for anxiety.   Yes [provider]  amphetamine-dextroamphetamine (ADDERALL XR) 30 MG 24 hr capsule Take 30 mg by mouth daily. Taken Mon to Fri, no weekends 07/23/21  Yes [provider]  fluticasone (FLONASE) 50 MCG/ACT nasal spray Place 1 spray into both nostrils daily as needed for allergies.    Yes [provider]  nicotine (NICODERM CQ - DOSED IN MG/24 HOURS) 21 mg/24hr patch 21 mg daily. 05/06/21  Yes [provider]  NON FORMULARY CPAP at bedtime   Yes [provider]  Tadalafil (CIALIS) 2.5 MG TABS Take 1 tablet (2.5 mg total) by mouth daily as needed (Erectile Dysfunction). 12/25/14  Yes Skeet Latch, MD  VENTOLIN HFA 108 [phone] Base) MCG/ACT inhaler Inhale 1 puff into the lungs every 6 (six) hours as needed for wheezing or shortness of breath. 12/03/21  Yes [provider]  amLODipine (NORVASC) 10 MG tablet Take 10 mg by mouth every evening. Patient not taking: Reported on 04/17/2022    [provider]  blood glucose meter kit and supplies Dispense based on patient and insurance preference. Use up to four times daily as directed. (FOR ICD-10 E10.9, E11.9). 07/30/21   Shelly Coss, MD  carvedilol (COREG) 25 MG tablet Take 1 tablet (25 mg total) by mouth 2 (two) times daily with a meal. Patient not taking: Reported on 04/17/2022 12/25/14   Skeet Latch, MD  empagliflozin (JARDIANCE) 10 MG TABS  tablet Take 1 tablet (10 mg total) by mouth daily. Patient not taking: Reported on 04/17/2022 07/31/21   Shelly Coss, MD  escitalopram (LEXAPRO) 20 MG tablet Take 20 mg by mouth daily. Patient not taking: Reported on 04/17/2022 05/06/21   [provider]  furosemide (LASIX) 40 MG tablet Take 1 tablet (40 mg total) by mouth every evening. 07/30/21 08/29/21  Shelly Coss, MD  Insulin Pen Needle 32G X 4 MM MISC 1 each by Does  not apply route 2 (two) times daily. 07/30/21   Shelly Coss, MD  levothyroxine (SYNTHROID) 200 MCG tablet Take 200 mcg by mouth daily. Patient not taking: Reported on 04/17/2022 07/22/20   [provider]  losartan (COZAAR) 50 MG tablet Take 1 tablet (50 mg total) by mouth 2 (two) times daily. Patient not taking: Reported on 04/17/2022 07/30/21   Shelly Coss, MD  ondansetron (ZOFRAN-ODT) 4 MG disintegrating tablet '4mg'$  ODT q4 hours prn nausea/vomit 11/07/21   Deno Etienne, DO  Semaglutide,0.25 or 0.'5MG'$ /DOS, 2 MG/3ML SOPN Inject 0.25 mg into the skin once a week. Patient not taking: Reported on 04/17/2022 07/30/21   Shelly Coss, MD  spironolactone (ALDACTONE) 25 MG tablet Take 25 mg by mouth every evening. Patient not taking: Reported on 04/17/2022 07/23/21   [provider]  tamsulosin (FLOMAX) 0.4 MG CAPS capsule Take 0.4 mg by mouth every evening. Patient not taking: Reported on 04/17/2022 05/06/21   [provider]  testosterone cypionate (DEPOTESTOSTERONE CYPIONATE) 200 MG/ML injection Inject 0.75 mLs into the muscle every Sunday. Patient not taking: Reported on 04/17/2022 05/08/21   [provider]  Vitamin D, Ergocalciferol, (DRISDOL) 1.25 MG (50000 UNIT) CAPS capsule Take 50,000 Units by mouth every Sunday. Patient not taking: Reported on 04/17/2022 06/20/21   [provider]      Allergies    Patient has no known allergies.    Review of Systems   Review of Systems  Constitutional:  Negative for fever.  Respiratory:  Positive for shortness of breath.   Cardiovascular:  Positive for leg swelling. Negative for chest pain.    Physical Exam Updated Vital Signs BP (!) 206/130   Pulse 88   Temp 98.4 F (36.9 C) (Oral)   Resp 18   Ht '5\' 9"'$  (1.753 m)   SpO2 96%   BMI 51.67 kg/m  Physical Exam Vitals and nursing note reviewed.  Constitutional:      General: He is not in acute distress.    Appearance: He is well-developed. He is obese.   HENT:     Head: Normocephalic and atraumatic.  Eyes:     Conjunctiva/sclera: Conjunctivae normal.  Cardiovascular:     Rate and Rhythm: Normal rate and regular rhythm.     Heart sounds: No murmur heard. Pulmonary:     Effort: No respiratory distress.     Breath sounds: Normal breath sounds.     Comments: Tachypnea Abdominal:     Palpations: Abdomen is soft.     Tenderness: There is no abdominal tenderness.  Musculoskeletal:        General: No swelling.     Cervical back: Neck supple.  Skin:    General: Skin is warm and dry.     Capillary Refill: Capillary refill takes less than 2 seconds.  Neurological:     Mental Status: He is alert.  Psychiatric:        Mood and Affect: Mood normal.     ED Results / Procedures / Treatments   Labs (all  labs ordered are listed, but only abnormal results are displayed) Labs Reviewed  BASIC METABOLIC PANEL - Abnormal; Notable for the following components:      Result Value   Potassium 3.0 (*)    Glucose, Bld 171 (*)    All other components within normal limits  CBC - Abnormal; Notable for the following components:   WBC 11.6 (*)    All other components within normal limits  BRAIN NATRIURETIC PEPTIDE - Abnormal; Notable for the following components:   B Natriuretic Peptide 322.2 (*)    All other components within normal limits  TROPONIN I (HIGH SENSITIVITY) - Abnormal; Notable for the following components:   Troponin I (High Sensitivity) 96 (*)    All other components within normal limits  TROPONIN I (HIGH SENSITIVITY) - Abnormal; Notable for the following components:   Troponin I (High Sensitivity) 90 (*)    All other components within normal limits    EKG EKG Interpretation  Date/Time:  Friday April 17 2022 18:58:06 EST Ventricular Rate:  99 PR Interval:  162 QRS Duration: 100 QT Interval:  372 QTC Calculation: 477 R Axis:   29 Text Interpretation: Normal sinus rhythm Biatrial enlargement Abnormal ECG When compared with  ECG of 25-Jul-2021 21:24, PREVIOUS ECG IS PRESENT Confirmed by Tretha Sciara [phone]) on 04/17/2022 8:35:03 PM  Radiology DG Chest 2 View  Result Date: 04/17/2022 CLINICAL DATA:  sob EXAM: CHEST - 2 VIEW COMPARISON:  Chest x-ray 11/07/2021 FINDINGS: The heart and mediastinal contours are unchanged. No focal consolidation. Slightly increased interstitial markings. No pleural effusion. No pneumothorax. No acute osseous abnormality. IMPRESSION: Findings suggestive of viral bronchiolitis versus reactive airway disease. No definite pulmonary edema. Electronically Signed   By: Iven Finn M.D.   On: 04/17/2022 19:21    Procedures .Critical Care  Performed by: Dorothyann Peng, PA-C Authorized by: Dorothyann Peng, PA-C   Critical care provider statement:    Critical care time (minutes):  35   Critical care was necessary to treat or prevent imminent or life-threatening deterioration of the following conditions:  Cardiac failure   Critical care was time spent personally by me on the following activities:  Development of treatment plan with patient or surrogate, discussions with consultants, evaluation of patient's response to treatment, examination of patient, ordering and review of laboratory studies, ordering and review of radiographic studies, ordering and performing treatments and interventions, pulse oximetry, re-evaluation of patient's condition and review of old charts     Medications Ordered in ED Medications  oseltamivir (TAMIFLU) capsule 75 mg (has no administration in time range)  furosemide (LASIX) injection 80 mg (80 mg Intravenous Given 04/17/22 2058)  diphenhydrAMINE (BENADRYL) injection 25 mg (25 mg Intravenous Given 04/17/22 2141)  potassium chloride SA (KLOR-CON M) CR tablet 40 mEq (40 mEq Oral Given 04/17/22 2142)  hydrALAZINE (APRESOLINE) injection 10 mg (10 mg Intravenous Given 04/17/22 2334)  losartan (COZAAR) tablet 50 mg (50 mg Oral Given 04/17/22 2335)  amLODipine  (NORVASC) tablet 10 mg (10 mg Oral Given 04/17/22 2335)  benzonatate (TESSALON) capsule 100 mg (100 mg Oral Given 04/17/22 2335)    ED Course/ Medical Decision Making/ A&P Clinical Course as of 04/17/22 2351  Fri Apr 17, 2022  2034 Stable volume overload and trop/BNP elevation Complete medication noncompliance.  Admit for CHF exacerbation [CC]    Clinical Course User Index [CC] Tretha Sciara, MD  Medical Decision Making Risk Prescription drug management. Decision regarding hospitalization.   This patient presents to the ED for concern of shortness of breath, this involves an extensive number of treatment options, and is a complaint that carries with it a high risk of complications and morbidity.  The differential diagnosis includes fluid overload, heart failure exacerbation, ACS, PE, COPD exacerbation, viral infection, and others   Co morbidities that complicate the patient evaluation  Noncompliance with medication, COPD, CHF, type II DM, hypertension   Additional history obtained:  Additional history obtained from wife at bedside External records from outside source obtained and reviewed including discharge summary from Jul 30, 2021.  Patient admitted for chest pain, acute on chronic diastolic heart failure, type II DM, hypothyroidism, hypertension, morbid obesity   Lab Tests:  I Ordered, and personally interpreted labs.  The pertinent results include: BNP 322.2, initial troponin 90 6 repeat 90;, WBC 11.6, potassium 3.0   Imaging Studies ordered:  I ordered imaging studies including chest x-ray I independently visualized and interpreted imaging which showed  Findings suggestive of viral bronchiolitis versus reactive airway  disease. No definite pulmonary edema.   I agree with the radiologist interpretation   Cardiac Monitoring: / EKG:  The patient was maintained on a cardiac monitor.  I personally viewed and interpreted the cardiac  monitored which showed an underlying rhythm of: Normal sinus rhythm   Consultations Obtained:  I requested consultation with the hospitalist,  and discussed lab and imaging findings as well as pertinent plan - they recommend: admission   Problem List / ED Course / Critical interventions / Medication management   I ordered medication including Lasix for fluid overload, Benadryl for possible localized allergic reaction to chlorhexidine scrub, potassium for hypokalemia; tamiflu for influenza Reevaluation of the patient after these medicines showed that the patient stayed the same I have reviewed the patients home medicines and have made adjustments as needed   Social Determinants of Health:  Patient has not had/did not know he had health insurance over the past few months   Test / Admission - Considered:  Patient appears to be mildly fluid overloaded, likely acute on chronic heart failure.  Patient has also been noncompliant medications for at least the past 2-2 and half months.  Patient is tachypneic and appears short of breath.  Patient is also positive for influenza B. The patient would benefit from admission for further evaluation management of his heart failure/fluid status and from adjustment of home medications as needed since the patient has been on no medications for 2 and half months.        Final Clinical Impression(s) / ED Diagnoses Final diagnoses:  Acute on chronic heart failure, unspecified heart failure type (HCC)  SOB (shortness of breath)  Acute cough  Influenza B    Rx / DC Orders ED Discharge Orders     None         Ronny Bacon 04/18/22 1512    Tretha Sciara, MD 04/18/22 [address] PA-C 04/28/22 1625    Tretha Sciara, MD 04/29/22 [phone]

## 2022-04-17 NOTE — ED Notes (Signed)
Pt has red rash on upper bilateral extrem. That started around 2045 today. PA [facility] notified

## 2022-04-17 NOTE — ED Notes (Signed)
Pt transported to xray 

## 2022-04-17 NOTE — ED Notes (Signed)
Medicated pt per orders as primary nurse is in an emergency. Pt in no acute distress, vitals stable. Call bell in reach.

## 2022-04-17 NOTE — ED Provider Triage Note (Signed)
Emergency Medicine Provider Triage Evaluation Note  Benjamin Small , a 52 y.o. male  was evaluated in triage.  Pt complains of fluid retention, SOB, and elevated BP. Says he has not taken fluid pills since November, even had to cut his boots off after work yesterday. Went to UC and was diagnosed with the flu today.   Review of Systems  Positive: SOB, fluid retention, flu like symptoms Negative: CP  Physical Exam  BP (!) 209/130 (BP Location: Right Arm)   Pulse 99   Temp 98.8 F (37.1 C)   Resp (!) 30   Ht 5' 9"$  (1.753 m)   SpO2 96%   BMI 51.67 kg/m  Gen:   Awake, no distress   Resp:  Normal effort  MSK:   Moves extremities without difficulty  Other:    Medical Decision Making  Medically screening exam initiated at 7:03 PM.  Appropriate orders placed.  [name] was informed that the remainder of the evaluation will be completed by another provider, this initial triage assessment does not replace that evaluation, and the importance of remaining in the ED until their evaluation is complete.  Workup initiated   [name] 04/17/22 1904

## 2022-04-17 NOTE — ED Triage Notes (Signed)
Pt sent from [facility] in [place]. Pt's BP was 236/150. Pt also has "extra fluid"- pt has not take fluid pills since November- pt had to cut boots off after work yesterday, hx of CHF. Pt c/o SHOB. Pt was also + for flu B yesterday.

## 2022-04-18 ENCOUNTER — Other Ambulatory Visit (HOSPITAL_COMMUNITY): Payer: BC Managed Care – PPO

## 2022-04-18 ENCOUNTER — Other Ambulatory Visit: Payer: Self-pay

## 2022-04-18 DIAGNOSIS — I160 Hypertensive urgency: Secondary | ICD-10-CM | POA: Diagnosis present

## 2022-04-18 DIAGNOSIS — Z6841 Body Mass Index (BMI) 40.0 and over, adult: Secondary | ICD-10-CM | POA: Diagnosis not present

## 2022-04-18 DIAGNOSIS — R0602 Shortness of breath: Secondary | ICD-10-CM | POA: Diagnosis present

## 2022-04-18 DIAGNOSIS — E039 Hypothyroidism, unspecified: Secondary | ICD-10-CM

## 2022-04-18 DIAGNOSIS — R609 Edema, unspecified: Secondary | ICD-10-CM | POA: Diagnosis not present

## 2022-04-18 DIAGNOSIS — E785 Hyperlipidemia, unspecified: Secondary | ICD-10-CM | POA: Diagnosis present

## 2022-04-18 DIAGNOSIS — F4310 Post-traumatic stress disorder, unspecified: Secondary | ICD-10-CM | POA: Diagnosis present

## 2022-04-18 DIAGNOSIS — J101 Influenza due to other identified influenza virus with other respiratory manifestations: Secondary | ICD-10-CM | POA: Diagnosis not present

## 2022-04-18 DIAGNOSIS — E119 Type 2 diabetes mellitus without complications: Secondary | ICD-10-CM | POA: Diagnosis present

## 2022-04-18 DIAGNOSIS — N400 Enlarged prostate without lower urinary tract symptoms: Secondary | ICD-10-CM | POA: Diagnosis present

## 2022-04-18 DIAGNOSIS — I878 Other specified disorders of veins: Secondary | ICD-10-CM | POA: Diagnosis present

## 2022-04-18 DIAGNOSIS — I110 Hypertensive heart disease with heart failure: Secondary | ICD-10-CM | POA: Diagnosis present

## 2022-04-18 DIAGNOSIS — F909 Attention-deficit hyperactivity disorder, unspecified type: Secondary | ICD-10-CM | POA: Diagnosis present

## 2022-04-18 DIAGNOSIS — I10 Essential (primary) hypertension: Secondary | ICD-10-CM

## 2022-04-18 DIAGNOSIS — E1165 Type 2 diabetes mellitus with hyperglycemia: Secondary | ICD-10-CM

## 2022-04-18 DIAGNOSIS — G4733 Obstructive sleep apnea (adult) (pediatric): Secondary | ICD-10-CM | POA: Diagnosis present

## 2022-04-18 DIAGNOSIS — Z803 Family history of malignant neoplasm of breast: Secondary | ICD-10-CM | POA: Diagnosis not present

## 2022-04-18 DIAGNOSIS — Z811 Family history of alcohol abuse and dependence: Secondary | ICD-10-CM | POA: Diagnosis not present

## 2022-04-18 DIAGNOSIS — Z79890 Hormone replacement therapy (postmenopausal): Secondary | ICD-10-CM | POA: Diagnosis not present

## 2022-04-18 DIAGNOSIS — F17210 Nicotine dependence, cigarettes, uncomplicated: Secondary | ICD-10-CM | POA: Diagnosis present

## 2022-04-18 DIAGNOSIS — I5033 Acute on chronic diastolic (congestive) heart failure: Secondary | ICD-10-CM

## 2022-04-18 DIAGNOSIS — J449 Chronic obstructive pulmonary disease, unspecified: Secondary | ICD-10-CM | POA: Diagnosis present

## 2022-04-18 DIAGNOSIS — Z79899 Other long term (current) drug therapy: Secondary | ICD-10-CM | POA: Diagnosis not present

## 2022-04-18 DIAGNOSIS — I5031 Acute diastolic (congestive) heart failure: Secondary | ICD-10-CM | POA: Diagnosis not present

## 2022-04-18 DIAGNOSIS — E876 Hypokalemia: Secondary | ICD-10-CM | POA: Diagnosis present

## 2022-04-18 DIAGNOSIS — Z91199 Patient's noncompliance with other medical treatment and regimen due to unspecified reason: Secondary | ICD-10-CM | POA: Diagnosis not present

## 2022-04-18 LAB — TSH: TSH: 3.925 u[IU]/mL (ref 0.350–4.500)

## 2022-04-18 MED ORDER — SPIRONOLACTONE 25 MG PO TABS
25.0000 mg | ORAL_TABLET | Freq: Every day | ORAL | Status: DC
  Administered 2022-04-18 – 2022-04-20 (×3): 25 mg via ORAL
  Filled 2022-04-18 (×3): qty 1

## 2022-04-18 MED ORDER — ACETAMINOPHEN 325 MG PO TABS
650.0000 mg | ORAL_TABLET | Freq: Four times a day (QID) | ORAL | Status: DC | PRN
  Administered 2022-04-18: 650 mg via ORAL
  Filled 2022-04-18: qty 2

## 2022-04-18 MED ORDER — SEMAGLUTIDE(0.25 OR 0.5MG/DOS) 2 MG/3ML ~~LOC~~ SOPN: 0.2500 mg | PEN_INJECTOR | SUBCUTANEOUS | Status: DC

## 2022-04-18 MED ORDER — LOSARTAN POTASSIUM 50 MG PO TABS
50.0000 mg | ORAL_TABLET | Freq: Every day | ORAL | Status: DC
  Administered 2022-04-19 – 2022-04-20 (×2): 50 mg via ORAL
  Filled 2022-04-18 (×2): qty 1

## 2022-04-18 MED ORDER — FUROSEMIDE 10 MG/ML IJ SOLN
40.0000 mg | Freq: Every day | INTRAMUSCULAR | Status: DC
  Administered 2022-04-18: 40 mg via INTRAVENOUS
  Filled 2022-04-18: qty 4

## 2022-04-18 MED ORDER — AMLODIPINE BESYLATE 10 MG PO TABS
10.0000 mg | ORAL_TABLET | Freq: Every evening | ORAL | Status: DC
  Administered 2022-04-18 – 2022-04-19 (×2): 10 mg via ORAL
  Filled 2022-04-18 (×2): qty 1

## 2022-04-18 MED ORDER — EMPAGLIFLOZIN 10 MG PO TABS
10.0000 mg | ORAL_TABLET | Freq: Every day | ORAL | Status: DC
  Administered 2022-04-18 – 2022-04-20 (×3): 10 mg via ORAL
  Filled 2022-04-18 (×4): qty 1

## 2022-04-18 MED ORDER — ONDANSETRON HCL 4 MG/2ML IJ SOLN: 4.0000 mg | Freq: Four times a day (QID) | INTRAMUSCULAR | Status: DC | PRN

## 2022-04-18 MED ORDER — CARVEDILOL 25 MG PO TABS
25.0000 mg | ORAL_TABLET | Freq: Two times a day (BID) | ORAL | Status: DC
  Administered 2022-04-18 – 2022-04-20 (×5): 25 mg via ORAL
  Filled 2022-04-18 (×3): qty 1
  Filled 2022-04-18: qty 2
  Filled 2022-04-18: qty 1

## 2022-04-18 MED ORDER — FUROSEMIDE 10 MG/ML IJ SOLN
80.0000 mg | Freq: Two times a day (BID) | INTRAMUSCULAR | Status: DC
  Administered 2022-04-18 – 2022-04-20 (×4): 80 mg via INTRAVENOUS
  Filled 2022-04-18 (×5): qty 8

## 2022-04-18 MED ORDER — SODIUM CHLORIDE 0.9% FLUSH: 3.0000 mL | INTRAVENOUS | Status: DC | PRN

## 2022-04-18 MED ORDER — HYDRALAZINE HCL 20 MG/ML IJ SOLN
10.0000 mg | INTRAMUSCULAR | Status: AC
  Administered 2022-04-18: 10 mg via INTRAVENOUS
  Filled 2022-04-18: qty 1

## 2022-04-18 MED ORDER — SODIUM CHLORIDE 0.9 % IV SOLN: 250.0000 mL | INTRAVENOUS | Status: DC | PRN

## 2022-04-18 MED ORDER — LOSARTAN POTASSIUM 50 MG PO TABS
50.0000 mg | ORAL_TABLET | Freq: Two times a day (BID) | ORAL | Status: DC
  Administered 2022-04-18: 50 mg via ORAL
  Filled 2022-04-18: qty 1

## 2022-04-18 MED ORDER — DIPHENHYDRAMINE HCL 50 MG/ML IJ SOLN
25.0000 mg | Freq: Four times a day (QID) | INTRAMUSCULAR | Status: DC | PRN
  Administered 2022-04-18: 25 mg via INTRAVENOUS
  Filled 2022-04-18: qty 1

## 2022-04-18 MED ORDER — ALPRAZOLAM 0.5 MG PO TABS
1.0000 mg | ORAL_TABLET | Freq: Every day | ORAL | Status: DC | PRN
  Administered 2022-04-18 – 2022-04-19 (×2): 1 mg via ORAL
  Filled 2022-04-18 (×2): qty 2

## 2022-04-18 MED ORDER — ENOXAPARIN SODIUM 40 MG/0.4ML IJ SOSY
40.0000 mg | PREFILLED_SYRINGE | INTRAMUSCULAR | Status: DC
  Administered 2022-04-18 – 2022-04-20 (×3): 40 mg via SUBCUTANEOUS
  Filled 2022-04-18 (×3): qty 0.4

## 2022-04-18 MED ORDER — ACETAMINOPHEN 325 MG PO TABS: 650.0000 mg | ORAL_TABLET | ORAL | Status: DC | PRN

## 2022-04-18 MED ORDER — LEVOTHYROXINE SODIUM 100 MCG PO TABS
200.0000 ug | ORAL_TABLET | Freq: Every day | ORAL | Status: DC
  Administered 2022-04-18 – 2022-04-20 (×3): 200 ug via ORAL
  Filled 2022-04-18 (×3): qty 2

## 2022-04-18 MED ORDER — BUTALBITAL-APAP-CAFFEINE 50-325-40 MG PO TABS: 1.0000 | ORAL_TABLET | Freq: Four times a day (QID) | ORAL | Status: DC | PRN

## 2022-04-18 MED ORDER — OSELTAMIVIR PHOSPHATE 75 MG PO CAPS
75.0000 mg | ORAL_CAPSULE | Freq: Two times a day (BID) | ORAL | Status: DC
  Administered 2022-04-18: 75 mg via ORAL
  Filled 2022-04-18 (×5): qty 1

## 2022-04-18 MED ORDER — NICOTINE 21 MG/24HR TD PT24
21.0000 mg | MEDICATED_PATCH | Freq: Every day | TRANSDERMAL | Status: DC
  Administered 2022-04-18 – 2022-04-20 (×3): 21 mg via TRANSDERMAL
  Filled 2022-04-18 (×3): qty 1

## 2022-04-18 MED ORDER — TAMSULOSIN HCL 0.4 MG PO CAPS
0.4000 mg | ORAL_CAPSULE | Freq: Every evening | ORAL | Status: DC
  Administered 2022-04-18 – 2022-04-19 (×2): 0.4 mg via ORAL
  Filled 2022-04-18 (×2): qty 1

## 2022-04-18 MED ORDER — SODIUM CHLORIDE 0.9% FLUSH
3.0000 mL | Freq: Two times a day (BID) | INTRAVENOUS | Status: DC
  Administered 2022-04-18 – 2022-04-20 (×6): 3 mL via INTRAVENOUS

## 2022-04-18 MED ORDER — POTASSIUM CHLORIDE CRYS ER 20 MEQ PO TBCR
40.0000 meq | EXTENDED_RELEASE_TABLET | Freq: Two times a day (BID) | ORAL | Status: AC
  Administered 2022-04-18 (×2): 40 meq via ORAL
  Filled 2022-04-18 (×2): qty 2

## 2022-04-18 MED ORDER — HYDRALAZINE HCL 20 MG/ML IJ SOLN: 10.0000 mg | INTRAMUSCULAR | Status: DC | PRN

## 2022-04-18 NOTE — ED Notes (Signed)
Pt report received from previous nurse. Pt A&O x4, vitals stable, denies needs/complaints. Call bell in reach. No acute distress noted.

## 2022-04-18 NOTE — Progress Notes (Signed)
Pt brought CPAP from home. Pt resting comfortably.

## 2022-04-18 NOTE — Assessment & Plan Note (Signed)
CPAP QHS ordered

## 2022-04-18 NOTE — Progress Notes (Addendum)
Patient seen and examined, admitted earlier this morning by Dr. Alcario Drought, Briefly 51/M with history of diastolic CHF, morbid obesity, type 2 diabetes, hypertension, hypothyroidism, OSA was on Medicaid, which she lost in November has been off of all his heart failure medicines since then and was unaware he had private insurance through his job.  Presented to the ED with progressive shortness of breath, edema also diagnosed with influenza B 2/15  Acute on chronic diastolic  -Echo [id number] with preserved EF, grade 1 diastolic dysfunction -Off all medications for 3 and half months -Continue IV Lasix today, increase dose to twice daily -Resume Jardiance, add Aldactone -Monitor I's/O, daily weights, replace potassium   Influenza B Pt tested positive for Influenza B yesterday per his report. -Continue Tamiflu   Essential hypertension -uncontrolled HTN / HTN urgency today, had been off of meds since Nov, but now he finds out he actually has insurance. -Continue amlodipine, Coreg, losartan -As needed hydralazine   DM type 2 (diabetes mellitus, type 2) ([place]) Resume Jardiance Resume Ozempic, check A1c   Hypothyroidism Resume Synthroid   Morbid obesity with BMI of 50.0-59.9, adult ([place]) Resume Ozempic for DM2 treatment. -needs wt loss, lifestyle modification   OSA (obstructive sleep apnea) CPAP QHS ordered   BPH -Continue Flomax  Tobacco abuse -Counseled, nicotine  Domenic Polite, MD

## 2022-04-18 NOTE — Assessment & Plan Note (Signed)
Check TSH Resume Synthroid

## 2022-04-18 NOTE — Assessment & Plan Note (Addendum)
In setting of uncontrolled accelerated hypertension. CHF pathway 2d echo Tele monitor Lasix 51m IV daily Strict intake and output BMP daily Resume jardiance and ozempic BP meds as below

## 2022-04-18 NOTE — Progress Notes (Incomplete)
Incident note Rounded on Benjamin Small in the ED this morning, see my note from earlier today, pt admitted after 2am by Dr.Gardner today Messaged by RN that wife would like me to call. I called the RN's number this afternoon around 1:40pm, wife was extremely upset that he isnt remarkably better, I explained that he's massively volume overloaded and has been off meds for 3-11mths and will take time to improve, she was furious that tylenol was added for headache today instead of something much stronger and that I had recommended weight loss to him earlier today, she was also upset about the quality of his ED bed and wanted me to change it ASAP. She was belligerent and yelling throughout the conversation, I asked her not to yell and she threatened to report me to my administrators.  PDomenic Polite MD

## 2022-04-18 NOTE — Assessment & Plan Note (Signed)
Resume Ozempic for DM2 treatment.

## 2022-04-18 NOTE — ED Notes (Signed)
ED TO INPATIENT HANDOFF REPORT  ED Nurse Name and Phone #: [phone]  S Name/Age/Gender Benjamin Small 52 y.o. male Room/Bed: 038C/038C  Code Status   Code Status: Full Code  Home/SNF/Other Home Patient oriented to: self, place, time, and situation Is this baseline? Yes   Triage Complete: Triage complete  Chief Complaint Acute on chronic diastolic CHF (congestive heart failure) ([place]) [I50.33]  Triage Note Pt sent from [facility] in [place]. Pt's BP was 236/150. Pt also has "extra fluid"- pt has not take fluid pills since November- pt had to cut boots off after work yesterday, hx of CHF. Pt c/o SHOB. Pt was also + for flu B yesterday.   Allergies No Known Allergies  Level of Care/Admitting Diagnosis ED Disposition     ED Disposition  Admit   Condition  --   Comment  Hospital Area: [facility] [[id number]]  Level of Care: Telemetry Cardiac [[id number]]  May place patient in observation at [facility] or [place] if equivalent level of care is available:: No  Covid Evaluation: Asymptomatic - no recent exposure (last 10 days) testing not required  Diagnosis: Acute on chronic diastolic CHF (congestive heart failure) [id number]  Admitting Physician: Etta Quill [phone]  Attending Physician: Etta Quill [[id number]]          B Medical/Surgery History Past Medical History:  Diagnosis Date   ADD (attention deficit disorder)    Adult ADHD    Anxiety disorder    Carpal tunnel syndrome on left    COPD (chronic obstructive pulmonary disease) ([place])    Essential hypertension 12/25/2014   Hashimoto's disease    Hyperlipidemia    Hypertension    Long term use of drug    Male erectile disorder    Migraines    Obesity    OSA (obstructive sleep apnea) 12/25/2014   Sleep apnea    Testicular hypofunction    Tobacco abuse 12/25/2014   Vitamin D deficiency    Past Surgical History:  Procedure Laterality Date   HEMORRHOID SURGERY      LEFT HEART CATH AND CORONARY ANGIOGRAPHY N/A 07/29/2021   Procedure: LEFT HEART CATH AND CORONARY ANGIOGRAPHY;  Surgeon: Belva Crome, MD;  Location: [facility];  Service: Cardiovascular;  Laterality: N/A;     A IV Location/Drains/Wounds Patient Lines/Drains/Airways Status     Active Line/Drains/Airways     Name Placement date Placement time Site Days   Peripheral IV 04/17/22 20 G Anterior;Distal;Right;Upper Arm 04/17/22  2038  Arm  1            Intake/Output Last 24 hours  Intake/Output Summary (Last 24 hours) at 04/18/2022 1324 Last data filed at 04/18/2022 1006 Gross per 24 hour  Intake --  Output 325 ml  Net -325 ml    Labs/Imaging Results for orders placed or performed during the hospital encounter of 04/17/22 (from the past 48 hour(s))  Basic metabolic panel     Status: Abnormal   Collection Time: 04/17/22  7:05 PM  Result Value Ref Range   Sodium 141 135 - 145 mmol/L   Potassium 3.0 (L) 3.5 - 5.1 mmol/L   Chloride 100 98 - 111 mmol/L   CO2 29 22 - 32 mmol/L   Glucose, Bld 171 (H) 70 - 99 mg/dL    Comment: Glucose reference range applies only to samples taken after fasting for at least 8 hours.   BUN 14 6 - 20 mg/dL   Creatinine, Ser  1.19 0.61 - 1.24 mg/dL   Calcium 9.4 8.9 - 10.3 mg/dL   GFR, Estimated >60 >60 mL/min    Comment: (NOTE) Calculated using the CKD-EPI Creatinine Equation (2021)    Anion gap 12 5 - 15    Comment: Performed at [facility] [address], [place], [place] [address]  CBC     Status: Abnormal   Collection Time: 04/17/22  7:05 PM  Result Value Ref Range   WBC 11.6 (H) 4.0 - 10.5 K/uL   RBC 5.08 4.22 - 5.81 MIL/uL   Hemoglobin 15.5 13.0 - 17.0 g/dL   HCT 44.6 39.0 - 52.0 %   MCV 87.8 80.0 - 100.0 fL   MCH 30.5 26.0 - 34.0 pg   MCHC 34.8 30.0 - 36.0 g/dL   RDW 14.0 11.5 - 15.5 %   Platelets 260 150 - 400 K/uL   nRBC 0.0 0.0 - 0.2 %    Comment: Performed at [facility], [place] [address] [place], [place]  [address]  Troponin I (High Sensitivity)     Status: Abnormal   Collection Time: 04/17/22  7:05 PM  Result Value Ref Range   Troponin I (High Sensitivity) 96 (H) <18 ng/L    Comment: (NOTE) Elevated high sensitivity troponin I (hsTnI) values and significant  changes across serial measurements may suggest ACS but many other  chronic and acute conditions are known to elevate hsTnI results.  Refer to the "Links" section for chest pain algorithms and additional  guidance. Performed at [facility], [place] [address], [place], [place] [address]   Brain natriuretic peptide     Status: Abnormal   Collection Time: 04/17/22  7:05 PM  Result Value Ref Range   B Natriuretic Peptide 322.2 (H) 0.0 - 100.0 pg/mL    Comment: Performed at [facility] [address], [place], [place] [address]  Troponin I (High Sensitivity)     Status: Abnormal   Collection Time: 04/17/22  8:39 PM  Result Value Ref Range   Troponin I (High Sensitivity) 90 (H) <18 ng/L    Comment: (NOTE) Elevated high sensitivity troponin I (hsTnI) values and significant  changes across serial measurements may suggest ACS but many other  chronic and acute conditions are known to elevate hsTnI results.  Refer to the "Links" section for chest pain algorithms and additional  guidance. Performed at [facility], [place] [address], [place], [place] [address]    DG Chest 2 View  Result Date: 04/17/2022 CLINICAL DATA:  sob EXAM: CHEST - 2 VIEW COMPARISON:  Chest x-ray 11/07/2021 FINDINGS: The heart and mediastinal contours are unchanged. No focal consolidation. Slightly increased interstitial markings. No pleural effusion. No pneumothorax. No acute osseous abnormality. IMPRESSION: Findings suggestive of viral bronchiolitis versus reactive airway disease. No definite pulmonary edema. Electronically Signed   By: Iven Finn M.D.   On: 04/17/2022 19:21    Pending Labs Unresulted Labs (From admission, onward)     Start      Ordered   04/19/22 [id number]  Basic metabolic panel  Daily,   R     Comments: As Scheduled for 5 days    04/18/22 0207   04/18/22 0152  TSH  Once,   URGENT        04/18/22 0152            Vitals/Pain Today's Vitals   04/18/22 0943 04/18/22 0945 04/18/22 0954 04/18/22 1246  BP:   (!) 188/120 Marland Kitchen)  141/93  Pulse:   87 72  Resp:   17 16  Temp:   (!) 97.3 F (36.3 C)   TempSrc:   Oral   SpO2:  98% 97% 100%  Height:      PainSc: 0-No pain  0-No pain     Isolation Precautions No active isolations  Medications Medications  oseltamivir (TAMIFLU) capsule 75 mg (has no administration in time range)  carvedilol (COREG) tablet 25 mg (25 mg Oral Given 04/18/22 0935)  amLODipine (NORVASC) tablet 10 mg (has no administration in time range)  levothyroxine (SYNTHROID) tablet 200 mcg (200 mcg Oral Given 04/18/22 0936)  hydrALAZINE (APRESOLINE) injection 10 mg (has no administration in time range)  empagliflozin (JARDIANCE) tablet 10 mg (10 mg Oral Given 04/18/22 0936)  tamsulosin (FLOMAX) capsule 0.4 mg (has no administration in time range)  sodium chloride flush (NS) 0.9 % injection 3 mL (3 mLs Intravenous Given 04/18/22 1310)  sodium chloride flush (NS) 0.9 % injection 3 mL (has no administration in time range)  0.9 %  sodium chloride infusion (has no administration in time range)  ondansetron (ZOFRAN) injection 4 mg (has no administration in time range)  enoxaparin (LOVENOX) injection 40 mg (40 mg Subcutaneous Given 04/18/22 0935)  nicotine (NICODERM CQ - dosed in mg/24 hours) patch 21 mg (21 mg Transdermal Patch Applied 04/18/22 0938)  oseltamivir (TAMIFLU) capsule 75 mg (75 mg Oral Not Given 04/18/22 0938)  furosemide (LASIX) injection 80 mg (has no administration in time range)  losartan (COZAAR) tablet 50 mg (has no administration in time range)  diphenhydrAMINE (BENADRYL) injection 25 mg (25 mg Intravenous Given 04/18/22 1309)  spironolactone (ALDACTONE) tablet 25 mg (25 mg Oral Given  04/18/22 1307)  acetaminophen (TYLENOL) tablet 650 mg (650 mg Oral Given 04/18/22 1308)  potassium chloride SA (KLOR-CON M) CR tablet 40 mEq (40 mEq Oral Given 04/18/22 1306)  furosemide (LASIX) injection 80 mg (80 mg Intravenous Given 04/17/22 2058)  diphenhydrAMINE (BENADRYL) injection 25 mg (25 mg Intravenous Given 04/17/22 2141)  potassium chloride SA (KLOR-CON M) CR tablet 40 mEq (40 mEq Oral Given 04/17/22 2142)  hydrALAZINE (APRESOLINE) injection 10 mg (10 mg Intravenous Given 04/17/22 2334)  losartan (COZAAR) tablet 50 mg (50 mg Oral Given 04/17/22 2335)  amLODipine (NORVASC) tablet 10 mg (10 mg Oral Given 04/17/22 2335)  benzonatate (TESSALON) capsule 100 mg (100 mg Oral Given 04/17/22 2335)  hydrALAZINE (APRESOLINE) injection 10 mg (10 mg Intravenous Given 04/18/22 0057)    Mobility walks     Focused Assessments Cardiac Assessment Handoff:  Cardiac Rhythm: Normal sinus rhythm No results found for: "CKTOTAL", "CKMB", "CKMBINDEX", "TROPONINI" No results found for: "DDIMER" Does the Patient currently have chest pain? No    Recommendations: See Admitting Provider Note  Report given to:   Additional Notes: Pt where CPAP at night the one he as is from home. BP was running I the [id number] systolically and [id number] diastolic. Is BP is 149/95. He is on droplet for the flu.

## 2022-04-18 NOTE — Plan of Care (Signed)

## 2022-04-18 NOTE — Assessment & Plan Note (Signed)
Resume Jardiance Resume Ozempic

## 2022-04-18 NOTE — Assessment & Plan Note (Addendum)
Pt with uncontrolled HTN / HTN urgency today, had been off of meds since Nov, but now he finds out he actually has insurance. Resume home HTN meds: Amlodipine Coreg Losartan PRN IV hydralazine Holding off on resuming aldactone for the moment (just doing lasix for the moment)

## 2022-04-18 NOTE — H&P (Signed)
History and Physical    Patient: Benjamin Small [id number] DOB: 1970/08/21 DOA: 04/17/2022 DOS: the patient was seen and examined on 04/18/2022 PCP: Pcp, No  Patient coming from: Home  Chief Complaint:  Chief Complaint  Patient presents with   Congestive Heart Failure   HPI: Benjamin Small is a 52 y.o. male with medical history significant of obesity, DM2, HTN, hypothyroidism, OSA.  Pt was on medicaid but lost medicaid in Nov so has been off of all meds.  Pt was actually unaware that he had private insurance through his job (and is actually insured) until just recently.  Pt in to ED with c/o edema.  Patient was noted to have a blood pressure of 236/150 at urgent care. Urgent care stated that the patient had "extra fluid". The patient states that his lower extremities were so swollen that yesterday after work he had to cut the right boot off of his foot in order to get his foot out.  Currently patient complains of shortness of breath, worse with lying down. He complains of significant swelling of the lower extremities. He complains of feeling tired. He was also diagnosed yesterday with influenza B.   The patient denies chest pain, abdominal pain, nausea, vomiting, headache. The patient also has a history of BPH and states that he has been having a difficult time with urination and has been off of his Flomax.    Review of Systems: As mentioned in the history of present illness. All other systems reviewed and are negative. Past Medical History:  Diagnosis Date   ADD (attention deficit disorder)    Adult ADHD    Anxiety disorder    Carpal tunnel syndrome on left    COPD (chronic obstructive pulmonary disease) ([place])    Essential hypertension 12/25/2014   Hashimoto's disease    Hyperlipidemia    Hypertension    Long term use of drug    Male erectile disorder    Migraines    Obesity    OSA (obstructive sleep apnea) 12/25/2014   Sleep apnea    Testicular hypofunction     Tobacco abuse 12/25/2014   Vitamin D deficiency    Past Surgical History:  Procedure Laterality Date   HEMORRHOID SURGERY     LEFT HEART CATH AND CORONARY ANGIOGRAPHY N/A 07/29/2021   Procedure: LEFT HEART CATH AND CORONARY ANGIOGRAPHY;  Surgeon: Belva Crome, MD;  Location: [facility];  Service: Cardiovascular;  Laterality: N/A;   Social History:  reports that he has been smoking. He has been smoking an average of 1 pack per day. He has never used smokeless tobacco. He reports current alcohol use. He reports that he does not use drugs.  No Known Allergies  Family History  Problem Relation Age of Onset   Cancer Father        SKIN   Alcohol abuse Mother    Cancer Maternal Grandmother    Cancer Maternal Grandfather    Breast cancer Paternal Grandmother     Prior to Admission medications   Medication Sig Start Date End Date Taking? Authorizing Provider  albuterol (PROVENTIL) (2.5 MG/3ML) 0.083% nebulizer solution Take 2.5 mg by nebulization every 6 (six) hours as needed for wheezing or shortness of breath. 12/04/21  Yes [provider]  ALPRAZolam Duanne Moron) 1 MG tablet Take 1 mg by mouth daily as needed for anxiety.   Yes [provider]  amphetamine-dextroamphetamine (ADDERALL XR) 30 MG 24 hr capsule Take 30 mg by mouth daily. Taken [employer]  to Fri, no weekends 07/23/21  Yes [provider]  fluticasone (FLONASE) 50 MCG/ACT nasal spray Place 1 spray into both nostrils daily as needed for allergies.    Yes [provider]  nicotine (NICODERM CQ - DOSED IN MG/24 HOURS) 21 mg/24hr patch 21 mg daily. 05/06/21  Yes [provider]  NON FORMULARY CPAP at bedtime   Yes [provider]  Tadalafil (CIALIS) 2.5 MG TABS Take 1 tablet (2.5 mg total) by mouth daily as needed (Erectile Dysfunction). 12/25/14  Yes Skeet Latch, MD  VENTOLIN HFA 108 [phone] Base) MCG/ACT inhaler Inhale 1 puff into the lungs every 6 (six) hours as needed for wheezing  or shortness of breath. 12/03/21  Yes [provider]  amLODipine (NORVASC) 10 MG tablet Take 10 mg by mouth every evening. Patient not taking: Reported on 04/17/2022    [provider]  blood glucose meter kit and supplies Dispense based on patient and insurance preference. Use up to four times daily as directed. (FOR ICD-10 E10.9, E11.9). 07/30/21   Shelly Coss, MD  carvedilol (COREG) 25 MG tablet Take 1 tablet (25 mg total) by mouth 2 (two) times daily with a meal. Patient not taking: Reported on 04/17/2022 12/25/14   Skeet Latch, MD  empagliflozin (JARDIANCE) 10 MG TABS tablet Take 1 tablet (10 mg total) by mouth daily. Patient not taking: Reported on 04/17/2022 07/31/21   Shelly Coss, MD  escitalopram (LEXAPRO) 20 MG tablet Take 20 mg by mouth daily. Patient not taking: Reported on 04/17/2022 05/06/21   [provider]  furosemide (LASIX) 40 MG tablet Take 1 tablet (40 mg total) by mouth every evening. 07/30/21 08/29/21  Shelly Coss, MD  Insulin Pen Needle 32G X 4 MM MISC 1 each by Does not apply route 2 (two) times daily. 07/30/21   Shelly Coss, MD  levothyroxine (SYNTHROID) 200 MCG tablet Take 200 mcg by mouth daily. Patient not taking: Reported on 04/17/2022 07/22/20   [provider]  losartan (COZAAR) 50 MG tablet Take 1 tablet (50 mg total) by mouth 2 (two) times daily. Patient not taking: Reported on 04/17/2022 07/30/21   Shelly Coss, MD  ondansetron (ZOFRAN-ODT) 4 MG disintegrating tablet 31m ODT q4 hours prn nausea/vomit 11/07/21   FDeno Etienne DO  Semaglutide,0.25 or 0.5MG/DOS, 2 MG/3ML SOPN Inject 0.25 mg into the skin once a week. Patient not taking: Reported on 04/17/2022 07/30/21   AShelly Coss MD  spironolactone (ALDACTONE) 25 MG tablet Take 25 mg by mouth every evening. Patient not taking: Reported on 04/17/2022 07/23/21   [provider]  tamsulosin (FLOMAX) 0.4 MG CAPS capsule Take 0.4 mg by mouth every evening. Patient  not taking: Reported on 04/17/2022 05/06/21   [provider]  testosterone cypionate (DEPOTESTOSTERONE CYPIONATE) 200 MG/ML injection Inject 0.75 mLs into the muscle every Sunday. Patient not taking: Reported on 04/17/2022 05/08/21   [provider]  Vitamin D, Ergocalciferol, (DRISDOL) 1.25 MG (50000 UNIT) CAPS capsule Take 50,000 Units by mouth every Sunday. Patient not taking: Reported on 04/17/2022 06/20/21   [provider]    Physical Exam: Vitals:   04/18/22 0000 04/18/22 0030 04/18/22 0055 04/18/22 0059  BP: (!) 188/116 (!) 177/116  (!) 187/125  Pulse: 84 80 80 80  Resp:   (!) 26 (!) 27  Temp:   98.9 F (37.2 C)   TempSrc:   Oral   SpO2: 97% 97% 97% 96%  Height:       Constitutional: NAD, calm, comfortable Respiratory:  clear to auscultation bilaterally, no wheezing, no crackles. Normal respiratory effort. No accessory muscle use.  Cardiovascular: Regular rate and rhythm, no murmurs / rubs / gallops. 2-3+ BLE edema Abdomen: no tenderness, no masses palpated. No hepatosplenomegaly. Bowel sounds positive.  Neurologic: CN 2-12 grossly intact. Sensation intact, DTR normal. Strength 5/5 in all 4.  Psychiatric: Normal judgment and insight. Alert and oriented x 3. Normal mood.   Data Reviewed:       Latest Ref Rng & Units 04/17/2022    7:05 PM 11/07/2021    1:54 PM 07/30/2021    1:55 AM  CBC  WBC 4.0 - 10.5 K/uL 11.6  11.5  9.2   Hemoglobin 13.0 - 17.0 g/dL 15.5  15.5  13.6   Hematocrit 39.0 - 52.0 % 44.6  45.2  40.5   Platelets 150 - 400 K/uL 260  254  218       Latest Ref Rng & Units 04/17/2022    7:05 PM 11/07/2021    1:54 PM 07/30/2021    1:55 AM  CMP  Glucose 70 - 99 mg/dL 171  123  113   BUN 6 - 20 mg/dL 14  12  17   $ Creatinine 0.61 - 1.24 mg/dL 1.19  1.03  1.19   Sodium 135 - 145 mmol/L 141  141  138   Potassium 3.5 - 5.1 mmol/L 3.0  3.5  3.5   Chloride 98 - 111 mmol/L 100  102  105   CO2 22 - 32 mmol/L 29  29  28   $ Calcium 8.9 - 10.3 mg/dL 9.4   9.3  8.9   Total Protein 6.5 - 8.1 g/dL  7.4    Total Bilirubin 0.3 - 1.2 mg/dL  0.6    Alkaline Phos 38 - 126 U/L  74    AST 15 - 41 U/L  31    ALT 0 - 44 U/L  37       Assessment and Plan: * Acute on chronic diastolic HF (heart failure) (HCC) In setting of uncontrolled accelerated hypertension. CHF pathway 2d echo Tele monitor Lasix 75m IV daily Strict intake and output BMP daily Resume jardiance and ozempic BP meds as below  Influenza B Pt tested positive for Influenza B yesterday per his report. Tamiflu  Essential hypertension Pt with uncontrolled HTN / HTN urgency today, had been off of meds since Nov, but now he finds out he actually has insurance. Resume home HTN meds: Amlodipine Coreg Losartan PRN IV hydralazine Holding off on resuming aldactone for the moment (just doing lasix for the moment)  DM type 2 (diabetes mellitus, type 2) (HCC) Resume Jardiance Resume Ozempic  Hypothyroidism Check TSH Resume Synthroid  Morbid obesity with BMI of 50.0-59.9, adult ([place] Resume Ozempic for DM2 treatment.  OSA (obstructive sleep apnea) CPAP QHS ordered      Advance Care Planning:   Code Status: Full Code  Consults: None  Family Communication: No family inroom  Severity of Illness: The appropriate patient status for this patient is OBSERVATION. Observation status is judged to be reasonable and necessary in order to provide the required intensity of service to ensure the patient's safety. The patient's presenting symptoms, physical exam findings, and initial radiographic and laboratory data in the context of their medical condition is felt to place them at decreased risk for further clinical deterioration. Furthermore, it is anticipated that the patient will be medically stable for discharge from the hospital within 2 midnights of admission.   Author: GAlcario Drought  Abed Schar M., DO 04/18/2022 2:12 AM  For on call review [email or web address].

## 2022-04-18 NOTE — Assessment & Plan Note (Signed)
Pt tested positive for Influenza B yesterday per his report. Tamiflu

## 2022-04-19 ENCOUNTER — Inpatient Hospital Stay (HOSPITAL_COMMUNITY): Payer: BC Managed Care – PPO

## 2022-04-19 DIAGNOSIS — I10 Essential (primary) hypertension: Secondary | ICD-10-CM | POA: Diagnosis not present

## 2022-04-19 DIAGNOSIS — R609 Edema, unspecified: Secondary | ICD-10-CM | POA: Diagnosis not present

## 2022-04-19 DIAGNOSIS — J101 Influenza due to other identified influenza virus with other respiratory manifestations: Secondary | ICD-10-CM | POA: Diagnosis not present

## 2022-04-19 DIAGNOSIS — E1165 Type 2 diabetes mellitus with hyperglycemia: Secondary | ICD-10-CM | POA: Diagnosis not present

## 2022-04-19 DIAGNOSIS — I5031 Acute diastolic (congestive) heart failure: Secondary | ICD-10-CM | POA: Diagnosis not present

## 2022-04-19 DIAGNOSIS — I5033 Acute on chronic diastolic (congestive) heart failure: Secondary | ICD-10-CM | POA: Diagnosis not present

## 2022-04-19 LAB — GLUCOSE, CAPILLARY
Glucose-Capillary: 169 mg/dL — ABNORMAL HIGH (ref 70–99)
Glucose-Capillary: 118 mg/dL — ABNORMAL HIGH (ref 70–99)
Glucose-Capillary: 200 mg/dL — ABNORMAL HIGH (ref 70–99)

## 2022-04-19 LAB — BASIC METABOLIC PANEL
Anion gap: 10 (ref 5–15)
BUN: 17 mg/dL (ref 6–20)
CO2: 27 mmol/L (ref 22–32)
Calcium: 8.5 mg/dL — ABNORMAL LOW (ref 8.9–10.3)
Chloride: 101 mmol/L (ref 98–111)
Creatinine, Ser: 1.2 mg/dL (ref 0.61–1.24)
GFR, Estimated: >60 mL/min (ref >60)
Glucose, Bld: 164 mg/dL — ABNORMAL HIGH (ref 70–99)
Potassium: 3.1 mmol/L — ABNORMAL LOW (ref 3.5–5.1)
Sodium: 138 mmol/L (ref 135–145)

## 2022-04-19 LAB — ECHOCARDIOGRAM COMPLETE
Area-P 1/2: 4.49 cm2
Calc EF: 47.4 %
Est EF: 55
Height: 69 in
S' Lateral: 5 cm
Single Plane A2C EF: 48.9 %
Single Plane A4C EF: 47.1 %
Weight: 5728 oz

## 2022-04-19 MED ORDER — POTASSIUM CHLORIDE CRYS ER 20 MEQ PO TBCR
40.0000 meq | EXTENDED_RELEASE_TABLET | ORAL | Status: AC
  Administered 2022-04-19 (×2): 40 meq via ORAL
  Filled 2022-04-19 (×2): qty 2

## 2022-04-19 MED ORDER — PERFLUTREN LIPID MICROSPHERE
1.0000 mL | INTRAVENOUS | Status: AC | PRN
  Administered 2022-04-19: 5 mL via INTRAVENOUS

## 2022-04-19 MED ORDER — INSULIN ASPART 100 UNIT/ML IJ SOLN
0.0000 [IU] | Freq: Three times a day (TID) | INTRAMUSCULAR | Status: DC
  Administered 2022-04-19: 2 [IU] via SUBCUTANEOUS
  Administered 2022-04-20: 1 [IU] via SUBCUTANEOUS

## 2022-04-19 MED ORDER — ESCITALOPRAM OXALATE 10 MG PO TABS
20.0000 mg | ORAL_TABLET | Freq: Every day | ORAL | Status: DC
  Administered 2022-04-19 – 2022-04-20 (×2): 20 mg via ORAL
  Filled 2022-04-19 (×2): qty 2

## 2022-04-19 NOTE — Progress Notes (Signed)
PROGRESS NOTE        PATIENT DETAILS Name: Benjamin Small Age: 52 y.o. Sex: male Date of Birth: [birth year] Admit Date: 04/17/2022 Admitting Physician Etta Quill, DO PCP:Pcp, No  Brief Summary: Patient is a 52 y.o.  male w hx of chronic HFpEF, DM-2, HTN, hypothyroidism, morbid obesity-who has been out of his medications for the past 2-3 months (lost Medicaid August 2023-did not realize he had private insurance)-was referred to [facility] ED by a local urgent care for uncontrolled hypertension, he was also found to have influenza and decompensated heart failure/fluid overload.  Significant events: 2/16>> admit to [facility]  Significant studies: 2/16>> CXR: Viral bronchiolitis versus reactive airway disease  Significant microbiology data: None  Procedures: None  Consults: None  Subjective: Sitting up-watching something on his iPad-continues to have significant swelling of his labs.  Claims right leg swelling is more than left.  Some mild erythema in his right leg.  Claims due to insurance issues-he was not taking his medications for the past 2-3 months, he only realized that he still has insurance a few days back.  He is on room air-and appears very comfortable.  Denies any shortness of breath.  Objective: Vitals: Blood pressure (!) 149/99, pulse 66, temperature 98 F (36.7 C), temperature source Oral, resp. rate 18, height 5' 9"$  (1.753 m), weight (!) 162.4 kg, SpO2 94 %.   Exam: Gen Exam:Alert awake-not in any distress HEENT:atraumatic, normocephalic Chest: B/L clear to auscultation anteriorly CVS:S1S2 regular Abdomen:soft non tender, non distended but obese. Extremities:++ right> leg edema.  Mild erythema right leg. Neurology: Non focal Skin: no rash  Pertinent Labs/Radiology:    Latest Ref Rng & Units 04/17/2022    7:05 PM 11/07/2021    1:54 PM 07/30/2021    1:55 AM  CBC  WBC 4.0 - 10.5 K/uL 11.6  11.5  9.2   Hemoglobin 13.0 - 17.0 g/dL 15.5   15.5  13.6   Hematocrit 39.0 - 52.0 % 44.6  45.2  40.5   Platelets 150 - 400 K/uL 260  254  218     Lab Results  Component Value Date   NA 138 04/19/2022   K 3.1 (L) 04/19/2022   CL 101 04/19/2022   CO2 27 04/19/2022      Assessment/Plan: Acute on chronic HFpEF Still significantly volume overloaded Intake/output not accurate-as patient not using the urinal-and per nurse has not been compliant with the hat in the toilet. Continue IV Lasix, Jardiance, Aldactone Await updated echo Given slight erythema/asymmetric lower extremity edema-checking Dopplers Follow weights/intake/output  Influenza B Some URI-like symptoms but claims much better today Remains on Tamiflu  Hypokalemia Replete/recheck  HTN BP stable this morning Continue Coreg/losartan/amlodipine  DM-2 Add SSI On Jardiance Resume Ozempic on discharge Awaiting A1c  Hypothyroidism Synthroid TSH stable on 2/17  OSA CPAP nightly  BPH Flomax  Tobacco abuse Transdermal nicotine  Anxiety disorder PTSD Continue as needed Xanax Resume Lexapro Claims he only takes Adderall as needed-on working days  Morbid Obesity Estimated body mass index is 52.87 kg/m as calculated from the following:   Height as of this encounter: 5' 9"$  (1.753 m).   Weight as of this encounter: 162.4 kg.   Code status:   Code Status: Full Code   DVT Prophylaxis: enoxaparin (LOVENOX) injection 40 mg Start: 04/18/22 1000   Family Communication: Spoke with spouse-Shannon [phone]  over the phone 2/18   Disposition Plan: Status is: Inpatient Remains inpatient appropriate because: Severity of illness.   Planned Discharge Destination:Home   Diet: Diet Order             Diet heart healthy/carb modified Room service appropriate? Yes; Fluid consistency: Thin; Fluid restriction: 1500 mL Fluid  Diet effective now                     Antimicrobial agents: Anti-infectives (From admission, onward)    Start      Dose/Rate Route Frequency Ordered Stop   04/18/22 1000  oseltamivir (TAMIFLU) capsule 75 mg        75 mg Oral 2 times daily 04/18/22 0212 04/22/22 2159   04/18/22 0000  oseltamivir (TAMIFLU) capsule 75 mg        75 mg Oral  Once 04/17/22 2350          MEDICATIONS: Scheduled Meds:  amLODipine  10 mg Oral QPM   carvedilol  25 mg Oral BID WC   empagliflozin  10 mg Oral Daily   enoxaparin (LOVENOX) injection  40 mg Subcutaneous Q24H   escitalopram  20 mg Oral Daily   furosemide  80 mg Intravenous BID   insulin aspart  0-9 Units Subcutaneous TID WC   levothyroxine  200 mcg Oral Q0600   losartan  50 mg Oral Daily   nicotine  21 mg Transdermal Daily   oseltamivir  75 mg Oral Once   oseltamivir  75 mg Oral BID   potassium chloride  40 mEq Oral Q4H   sodium chloride flush  3 mL Intravenous Q12H   spironolactone  25 mg Oral Daily   tamsulosin  0.4 mg Oral QPM   Continuous Infusions:  sodium chloride     PRN Meds:.sodium chloride, acetaminophen, ALPRAZolam, butalbital-acetaminophen-caffeine, diphenhydrAMINE, hydrALAZINE, ondansetron (ZOFRAN) IV, sodium chloride flush   I have personally reviewed following labs and imaging studies  LABORATORY DATA: CBC: Recent Labs  Lab 04/17/22 1905  WBC 11.6*  HGB 15.5  HCT 44.6  MCV 87.8  PLT 123456    Basic Metabolic Panel: Recent Labs  Lab 04/17/22 1905 04/19/22 0010  NA 141 138  K 3.0* 3.1*  CL 100 101  CO2 29 27  GLUCOSE 171* 164*  BUN 14 17  CREATININE 1.19 1.20  CALCIUM 9.4 8.5*    GFR: Estimated Creatinine Clearance: 113.1 mL/min (by C-G formula based on SCr of 1.2 mg/dL).  Liver Function Tests: No results for input(s): "AST", "ALT", "ALKPHOS", "BILITOT", "PROT", "ALBUMIN" in the last 168 hours. No results for input(s): "LIPASE", "AMYLASE" in the last 168 hours. No results for input(s): "AMMONIA" in the last 168 hours.  Coagulation Profile: No results for input(s): "INR", "PROTIME" in the last 168 hours.  Cardiac  Enzymes: No results for input(s): "CKTOTAL", "CKMB", "CKMBINDEX", "TROPONINI" in the last 168 hours.  BNP (last 3 results) No results for input(s): "PROBNP" in the last 8760 hours.  Lipid Profile: No results for input(s): "CHOL", "HDL", "LDLCALC", "TRIG", "CHOLHDL", "LDLDIRECT" in the last 72 hours.  Thyroid Function Tests: Recent Labs    04/18/22 1501  TSH 3.925    Anemia Panel: No results for input(s): "VITAMINB12", "FOLATE", "FERRITIN", "TIBC", "IRON", "RETICCTPCT" in the last 72 hours.  Urine analysis:    Component Value Date/Time   COLORURINE YELLOW 11/07/2021 [place] 11/07/2021 1253   LABSPEC 1.015 11/07/2021 1253   PHURINE 6.0 11/07/2021 1253   GLUCOSEU NEGATIVE 11/07/2021 1253  HGBUR NEGATIVE 11/07/2021 [place] 11/07/2021 [place] 11/07/2021 1253   PROTEINUR 30 (A) 11/07/2021 1253   NITRITE NEGATIVE 11/07/2021 1253   LEUKOCYTESUR NEGATIVE 11/07/2021 1253    Sepsis Labs: Lactic Acid, Venous No results found for: "LATICACIDVEN"  MICROBIOLOGY: No results found for this or any previous visit (from the past 240 hour(s)).  RADIOLOGY STUDIES/RESULTS: DG Chest 2 View  Result Date: 04/17/2022 CLINICAL DATA:  sob EXAM: CHEST - 2 VIEW COMPARISON:  Chest x-ray 11/07/2021 FINDINGS: The heart and mediastinal contours are unchanged. No focal consolidation. Slightly increased interstitial markings. No pleural effusion. No pneumothorax. No acute osseous abnormality. IMPRESSION: Findings suggestive of viral bronchiolitis versus reactive airway disease. No definite pulmonary edema. Electronically Signed   By: Iven Finn M.D.   On: 04/17/2022 19:21     LOS: 1 day   Oren Binet, MD  [facility]    To contact the attending provider between 7A-7P or the covering provider during after hours 7P-7A, please log into the web site [email or web address] and access using universal [place] password for that web site. If  you do not have the password, please call the hospital operator.  04/19/2022, 7:57 AM

## 2022-04-19 NOTE — Plan of Care (Signed)

## 2022-04-19 NOTE — Progress Notes (Signed)
VASCULAR LAB    Bilateral lower extremity venous duplex has been performed.  See CV proc for preliminary results.   Siah Steely, RVT 04/19/2022, 10:45 AM

## 2022-04-20 ENCOUNTER — Other Ambulatory Visit (HOSPITAL_COMMUNITY): Payer: Self-pay

## 2022-04-20 ENCOUNTER — Telehealth (HOSPITAL_COMMUNITY): Payer: Self-pay | Admitting: Pharmacy Technician

## 2022-04-20 DIAGNOSIS — I5033 Acute on chronic diastolic (congestive) heart failure: Secondary | ICD-10-CM | POA: Diagnosis not present

## 2022-04-20 DIAGNOSIS — E1165 Type 2 diabetes mellitus with hyperglycemia: Secondary | ICD-10-CM | POA: Diagnosis not present

## 2022-04-20 DIAGNOSIS — J101 Influenza due to other identified influenza virus with other respiratory manifestations: Secondary | ICD-10-CM | POA: Diagnosis not present

## 2022-04-20 DIAGNOSIS — I10 Essential (primary) hypertension: Secondary | ICD-10-CM | POA: Diagnosis not present

## 2022-04-20 LAB — HEMOGLOBIN A1C
Hgb A1c MFr Bld: 6.8 % — ABNORMAL HIGH (ref 4.8–5.6)
Mean Plasma Glucose: 148.46 mg/dL

## 2022-04-20 LAB — BASIC METABOLIC PANEL
Anion gap: 10 (ref 5–15)
BUN: 23 mg/dL — ABNORMAL HIGH (ref 6–20)
CO2: 28 mmol/L (ref 22–32)
Calcium: 8.6 mg/dL — ABNORMAL LOW (ref 8.9–10.3)
Chloride: 101 mmol/L (ref 98–111)
Creatinine, Ser: 1.31 mg/dL — ABNORMAL HIGH (ref 0.61–1.24)
GFR, Estimated: >60 mL/min (ref >60)
Glucose, Bld: 132 mg/dL — ABNORMAL HIGH (ref 70–99)
Potassium: 3.9 mmol/L (ref 3.5–5.1)
Sodium: 139 mmol/L (ref 135–145)

## 2022-04-20 LAB — GLUCOSE, CAPILLARY
Glucose-Capillary: 142 mg/dL — ABNORMAL HIGH (ref 70–99)
Glucose-Capillary: 244 mg/dL — ABNORMAL HIGH (ref 70–99)

## 2022-04-20 MED ORDER — ACCU-CHEK GUIDE VI STRP
1.0000 | ORAL_STRIP | Freq: Three times a day (TID) | 0 refills | Status: AC
  Filled 2022-04-20: qty 100, 30d supply, fill #0

## 2022-04-20 MED ORDER — ACCU-CHEK GUIDE ME W/DEVICE KIT
1.0000 | PACK | Freq: Three times a day (TID) | 0 refills | Status: AC
  Filled 2022-04-20: qty 1, 30d supply, fill #0

## 2022-04-20 MED ORDER — LOSARTAN POTASSIUM 50 MG PO TABS
50.0000 mg | ORAL_TABLET | Freq: Two times a day (BID) | ORAL | 1 refills | Status: AC
  Filled 2022-04-20: qty 60, 30d supply, fill #0

## 2022-04-20 MED ORDER — VITAMIN D (ERGOCALCIFEROL) 1.25 MG (50000 UNIT) PO CAPS
50000.0000 [IU] | ORAL_CAPSULE | ORAL | 0 refills | Status: AC
  Filled 2022-04-20: qty 5, 30d supply, fill #0

## 2022-04-20 MED ORDER — FLUTICASONE PROPIONATE 50 MCG/ACT NA SUSP
1.0000 | Freq: Every day | NASAL | 1 refills | Status: AC | PRN
  Filled 2022-04-20: qty 16, 30d supply, fill #0

## 2022-04-20 MED ORDER — INSULIN PEN NEEDLE 32G X 4 MM MISC
1.0000 | Freq: Two times a day (BID) | 0 refills | Status: AC
  Filled 2022-04-20: qty 100, 30d supply, fill #0

## 2022-04-20 MED ORDER — ALPRAZOLAM 1 MG PO TABS
1.0000 mg | ORAL_TABLET | Freq: Every day | ORAL | 0 refills | Status: AC | PRN
  Filled 2022-04-20: qty 10, 10d supply, fill #0

## 2022-04-20 MED ORDER — OSELTAMIVIR PHOSPHATE 75 MG PO CAPS
75.0000 mg | ORAL_CAPSULE | Freq: Two times a day (BID) | ORAL | 0 refills | Status: AC
  Filled 2022-04-20: qty 5, 3d supply, fill #0

## 2022-04-20 MED ORDER — ACCU-CHEK SOFTCLIX LANCET DEV KIT
1.0000 | PACK | Freq: Three times a day (TID) | 0 refills | Status: AC
  Filled 2022-04-20: qty 1, 30d supply, fill #0

## 2022-04-20 MED ORDER — FUROSEMIDE 40 MG PO TABS
40.0000 mg | ORAL_TABLET | Freq: Every day | ORAL | 1 refills | Status: AC
  Filled 2022-04-20: qty 30, 30d supply, fill #0

## 2022-04-20 MED ORDER — LEVOTHYROXINE SODIUM 200 MCG PO TABS
200.0000 ug | ORAL_TABLET | Freq: Every day | ORAL | 1 refills | Status: AC
  Filled 2022-04-20: qty 30, 30d supply, fill #0

## 2022-04-20 MED ORDER — SPIRONOLACTONE 25 MG PO TABS
25.0000 mg | ORAL_TABLET | Freq: Every evening | ORAL | 1 refills | Status: AC
  Filled 2022-04-20: qty 30, 30d supply, fill #0

## 2022-04-20 MED ORDER — TAMSULOSIN HCL 0.4 MG PO CAPS
0.4000 mg | ORAL_CAPSULE | Freq: Every evening | ORAL | 1 refills | Status: AC
  Filled 2022-04-20: qty 30, 30d supply, fill #0

## 2022-04-20 MED ORDER — EMPAGLIFLOZIN 10 MG PO TABS
10.0000 mg | ORAL_TABLET | Freq: Every day | ORAL | 1 refills | Status: AC
  Filled 2022-04-20: qty 30, 30d supply, fill #0

## 2022-04-20 MED ORDER — AMPHETAMINE-DEXTROAMPHET ER 30 MG PO CP24
30.0000 mg | ORAL_CAPSULE | Freq: Every day | ORAL | 0 refills | Status: DC
  Filled 2022-04-20: qty 20, 20d supply, fill #0

## 2022-04-20 MED ORDER — AMLODIPINE BESYLATE 10 MG PO TABS
10.0000 mg | ORAL_TABLET | Freq: Every evening | ORAL | 1 refills | Status: AC
  Filled 2022-04-20: qty 30, 30d supply, fill #0

## 2022-04-20 MED ORDER — SEMAGLUTIDE(0.25 OR 0.5MG/DOS) 2 MG/3ML ~~LOC~~ SOPN: 0.2500 mg | PEN_INJECTOR | SUBCUTANEOUS | 1 refills | Status: AC

## 2022-04-20 MED ORDER — CARVEDILOL 25 MG PO TABS
25.0000 mg | ORAL_TABLET | Freq: Two times a day (BID) | ORAL | 1 refills | Status: AC
  Filled 2022-04-20: qty 60, 30d supply, fill #0

## 2022-04-20 MED ORDER — SEMAGLUTIDE(0.25 OR 0.5MG/DOS) 2 MG/3ML ~~LOC~~ SOPN
0.2500 mg | PEN_INJECTOR | SUBCUTANEOUS | 1 refills | Status: DC
  Filled 2022-04-20: qty 9, 30d supply, fill #0

## 2022-04-20 MED ORDER — AMPHETAMINE-DEXTROAMPHET ER 30 MG PO CP24: 30.0000 mg | ORAL_CAPSULE | Freq: Every day | ORAL | 0 refills | Status: AC

## 2022-04-20 MED ORDER — VENTOLIN HFA 108 (90 BASE) MCG/ACT IN AERS
1.0000 | INHALATION_SPRAY | Freq: Four times a day (QID) | RESPIRATORY_TRACT | 1 refills | Status: AC | PRN
  Filled 2022-04-20: qty 18, 30d supply, fill #0

## 2022-04-20 MED ORDER — ACCU-CHEK SOFTCLIX LANCETS MISC
1.0000 | Freq: Three times a day (TID) | 0 refills | Status: AC
  Filled 2022-04-20: qty 100, 30d supply, fill #0

## 2022-04-20 MED ORDER — ESCITALOPRAM OXALATE 20 MG PO TABS
20.0000 mg | ORAL_TABLET | Freq: Every day | ORAL | 1 refills | Status: AC
  Filled 2022-04-20: qty 30, 30d supply, fill #0

## 2022-04-20 NOTE — Telephone Encounter (Signed)
Patient Advocate Encounter   Received notification that prior authorization for Ozempic (0.25 or 0.5 MG/DOSE) 2MG/3ML pen-injectors is required.   PA submitted on 04/20/2022 [place] [id number] Status is pending       Lyndel Safe, [place] Patient Advocate Specialist [facility] Patient Advocate Team Direct Number: [phone]  Fax: [phone]

## 2022-04-20 NOTE — Discharge Summary (Signed)
PATIENT DETAILS Name: Benjamin Small Age: 52 y.o. Sex: male Date of Birth: [birth year] MRN: [id number]. Admitting Physician: Etta Quill, DO PCP:Pcp, No  Admit Date: 04/17/2022 Discharge date: 04/20/2022  Recommendations for Outpatient Follow-up:  Follow up with PCP in 1-2 weeks Please obtain CMP/CBC in one week Please ensure follow-up with cardiology.  Admitted From:  Home  Disposition: Home   Discharge Condition: good  CODE STATUS:   Code Status: Full Code   Diet recommendation:  Diet Order             Diet - low sodium heart healthy           Diet Carb Modified           Diet heart healthy/carb modified Room service appropriate? Yes; Fluid consistency: Thin; Fluid restriction: 1500 mL Fluid  Diet effective now                    Brief Summary: Patient is a 52 y.o.  male w hx of chronic HFpEF, DM-2, HTN, hypothyroidism, morbid obesity-who has been out of his medications for the past 2-3 months (lost Medicaid August 2023-did not realize he had private insurance)-was referred to [facility] ED by a local urgent care for uncontrolled hypertension, he was also found to have influenza and decompensated heart failure/fluid overload.   Significant events: 2/16>> admit to [facility]   Significant studies: 2/16>> CXR: Viral bronchiolitis versus reactive airway disease 2/18>> echo: EF [id number], grade 2 diastolic dysfunction. 2/18>> bilateral lower extremity Dopplers: No DVT.   Significant microbiology data: None   Procedures: None   Consults: None  Brief Hospital Course: Acute on chronic HFpEF Secondary noncompliance-off medications for the past 2-3 months-see above.   Much better after being placed back on diuretic regimen  Lungs are clear-he is on room air-denies any exertional dyspnea Edema in lower extremity has reduced quite a bit-he appears to have disproportionate RLE edema (may have started to develop venous stasis).  Bilateral lower extremity Dopplers are  negative.  Edema in left lower extremity is minimal now.  Compression stockings for right lower extremity ordered on day of discharge. Plan is to transition to oral diuretics, continue beta-blocker/losartan, Jardiance/Aldactone on discharge. Follow-up appointment with cardiology arranged on 2/27 Extensive counseling regarding compliance-and fluid/salt restriction discussed on day of discharge.   Influenza B Some URI-like symptoms but claims much better today Continue Tamiflu   Hypokalemia Repleted.   Hypertensive urgency Blood pressure significantly elevated when he first presented Much better after initiation of Coreg/losartan/amlodipine/diuretics Continue to follow in the outpatient setting.   DM-2 (A1c 6.8 on 2/19) Continue Jardiance/Ozempic Follow with PCP for further optimization   Hypothyroidism Synthroid TSH stable on 2/17   OSA CPAP nightly   BPH Flomax   Tobacco abuse Counseled   Anxiety disorder PTSD Continue as needed Xanax Resume Lexapro Claims he only takes Adderall as needed-on working days   Morbid Obesity Counseled regarding importance of weight loss/exercise regimen/dietary modification.  Estimated body mass index is 52.87 kg/m as calculated from the following:   Height as of this encounter: 5' 9"$  (1.753 m).   Weight as of this encounter: 162.4 kg.     Note-spouse called on day of discharge-she did not answer-left voicemail.  Extensive discussion with patient on day of discharge including counseling done.  Discharge Diagnoses:  Principal Problem:   Acute on chronic diastolic HF (heart failure) (HCC) Active Problems:   Essential hypertension   Influenza B   DM type 2 (  diabetes mellitus, type 2) ([place])   Hypothyroidism   Morbid obesity with BMI of 50.0-59.9, adult (HCC)   OSA (obstructive sleep apnea)   Acute on chronic diastolic CHF (congestive heart failure) ([place])   Discharge Instructions:  Activity:  As tolerated  Discharge  Instructions     (HEART FAILURE PATIENTS) Call MD:  Anytime you have any of the following symptoms: 1) 3 pound weight gain in 24 hours or 5 pounds in 1 week 2) shortness of breath, with or without a dry hacking cough 3) swelling in the hands, feet or stomach 4) if you have to sleep on extra pillows at night in order to breathe.   Complete by: As directed    Call MD for:  difficulty breathing, headache or visual disturbances   Complete by: As directed    Diet - low sodium heart healthy   Complete by: As directed    Diet Carb Modified   Complete by: As directed    Discharge instructions   Complete by: As directed    Follow with  Pcp in 1-2 weeks  You have a follow-up appointment with cardiology on 2/27-please keep this appointment.  Please be compliant with all your medications as prescribed.  Limit fluid restriction to 1500 cc daily.  Wear compression stockings during the daytime.  Try to keep your right leg elevated.  Please get a complete blood count and chemistry panel checked by your Primary MD at your next visit, and again as instructed by your Primary MD.  Get Medicines reviewed and adjusted: Please take all your medications with you for your next visit with your Primary MD  Laboratory/radiological data: Please request your Primary MD to go over all hospital tests and procedure/radiological results at the follow up, please ask your Primary MD to get all Hospital records sent to his/her office.  In some cases, they will be blood work, cultures and biopsy results pending at the time of your discharge. Please request that your primary care M.D. follows up on these results.  Also Note the following: If you experience worsening of your admission symptoms, develop shortness of breath, life threatening emergency, suicidal or homicidal thoughts you must seek medical attention immediately by calling 911 or calling your MD immediately  if symptoms less severe.  You must read complete  instructions/literature along with all the possible adverse reactions/side effects for all the Medicines you take and that have been prescribed to you. Take any new Medicines after you have completely understood and accpet all the possible adverse reactions/side effects.   Do not drive when taking Pain medications or sleeping medications (Benzodaizepines)  Do not take more than prescribed Pain, Sleep and Anxiety Medications. It is not advisable to combine anxiety,sleep and pain medications without talking with your primary care practitioner  Special Instructions: If you have smoked or chewed Tobacco  in the last 2 yrs please stop smoking, stop any regular Alcohol  and or any Recreational drug use.  Wear Seat belts while driving.  Please note: You were cared for by a hospitalist during your hospital stay. Once you are discharged, your primary care physician will handle any further medical issues. Please note that NO REFILLS for any discharge medications will be authorized once you are discharged, as it is imperative that you return to your primary care physician (or establish a relationship with a primary care physician if you do not have one) for your post hospital discharge needs so that they can reassess your need for medications  and monitor your lab values.   Increase activity slowly   Complete by: As directed       Allergies as of 04/20/2022   No Known Allergies      Medication List     STOP taking these medications    nicotine 21 mg/24hr patch Commonly known as: NICODERM CQ - dosed in mg/24 hours   ondansetron 4 MG disintegrating tablet Commonly known as: ZOFRAN-ODT   testosterone cypionate 200 MG/ML injection Commonly known as: DEPOTESTOSTERONE CYPIONATE       TAKE these medications    ALPRAZolam 1 MG tablet Commonly known as: XANAX Take 1 mg by mouth daily as needed for anxiety.   amLODipine 10 MG tablet Commonly known as: NORVASC Take 1 tablet (10 mg total) by  mouth every evening.   amphetamine-dextroamphetamine 30 MG 24 hr capsule Commonly known as: ADDERALL XR Take 30 mg by mouth daily. Taken Mon to Fri, no weekends   blood glucose meter kit and supplies Dispense based on patient and insurance preference. Use up to four times daily as directed. (FOR ICD-10 E10.9, E11.9).   Blood Glucose Monitoring Suppl Devi 1 each by Does not apply route in the morning, at noon, and at bedtime. May substitute to any manufacturer covered by patient's insurance.   BLOOD GLUCOSE TEST STRIPS Strp 1 each by In Vitro route in the morning, at noon, and at bedtime. May substitute to any manufacturer covered by patient's insurance.   carvedilol 25 MG tablet Commonly known as: COREG Take 1 tablet (25 mg total) by mouth 2 (two) times daily with a meal.   empagliflozin 10 MG Tabs tablet Commonly known as: JARDIANCE Take 1 tablet (10 mg total) by mouth daily.   escitalopram 20 MG tablet Commonly known as: LEXAPRO Take 1 tablet (20 mg total) by mouth daily.   fluticasone 50 MCG/ACT nasal spray Commonly known as: FLONASE Place 1 spray into both nostrils daily as needed for allergies.   furosemide 40 MG tablet Commonly known as: LASIX Take 1 tablet (40 mg total) by mouth daily. What changed: when to take this   Insulin Pen Needle 32G X 4 MM Misc 1 each by Does not apply route 2 (two) times daily.   Lancet Device Misc 1 each by Does not apply route in the morning, at noon, and at bedtime. May substitute to any manufacturer covered by patient's insurance.   Lancets Misc. Misc 1 each by Does not apply route in the morning, at noon, and at bedtime. May substitute to any manufacturer covered by patient's insurance.   levothyroxine 200 MCG tablet Commonly known as: SYNTHROID Take 1 tablet (200 mcg total) by mouth daily.   losartan 50 MG tablet Commonly known as: COZAAR Take 1 tablet (50 mg total) by mouth 2 (two) times daily.   NON FORMULARY CPAP at  bedtime   oseltamivir 75 MG capsule Commonly known as: TAMIFLU Take 1 capsule (75 mg total) by mouth 2 (two) times daily for 5 doses.   Semaglutide(0.25 or 0.5MG/DOS) 2 MG/3ML Sopn Inject 0.25 mg into the skin once a week.   spironolactone 25 MG tablet Commonly known as: ALDACTONE Take 1 tablet (25 mg total) by mouth every evening.   Tadalafil 2.5 MG Tabs Commonly known as: Cialis Take 1 tablet (2.5 mg total) by mouth daily as needed (Erectile Dysfunction).   tamsulosin 0.4 MG Caps capsule Commonly known as: FLOMAX Take 1 capsule (0.4 mg total) by mouth every evening.   Ventolin HFA 108 (  90 Base) MCG/ACT inhaler Generic drug: albuterol Inhale 1 puff into the lungs every 6 (six) hours as needed for wheezing or shortness of breath. What changed: Another medication with the same name was removed. Continue taking this medication, and follow the directions you see here.   Vitamin D (Ergocalciferol) 1.25 MG (50000 UNIT) Caps capsule Commonly known as: DRISDOL Take 1 capsule (50,000 Units total) by mouth every Sunday. Start taking on: April 26, 2022        Follow-up Information     O'Neal, Cassie Freer, MD Follow up on 04/28/2022.   Specialties: Cardiology, Internal Medicine, Radiology Why: apointment at 1:40 pm Contact information: [address] [place] [address] [phone]         Primary care practitioner. Schedule an appointment as soon as possible for a visit in 1 week(s).                 No Known Allergies   Other Procedures/Studies: VAS [place] LOWER EXTREMITY VENOUS (DVT)  Result Date: 04/19/2022  Lower Venous DVT Study Patient Name:  KALVEN ARGENT  Date of Exam:   04/19/2022 Medical Rec #: [id number]          Accession #:    [id number] Date of Birth: 11-02-1970          Patient Gender: M Patient Age:   34 years Exam Location:  [facility] Procedure:      VAS [place] LOWER EXTREMITY VENOUS (DVT) Referring Phys: Oren Binet  --------------------------------------------------------------------------------  Indications: Edema.  Comparison Study: No prior study on file Performing Technologist: Sharion Dove RVS  Examination Guidelines: A complete evaluation includes B-mode imaging, spectral Doppler, color Doppler, and power Doppler as needed of all accessible portions of each vessel. Bilateral testing is considered an integral part of a complete examination. Limited examinations for reoccurring indications may be performed as noted. The reflux portion of the exam is performed with the patient in reverse Trendelenburg.  +---------+---------------+---------+-----------+--------------+--------------+ RIGHT    CompressibilityPhasicitySpontaneityProperties    Thrombus Aging +---------+---------------+---------+-----------+--------------+--------------+ CFV      Full                               pulsatile flow               +---------+---------------+---------+-----------+--------------+--------------+ SFJ      Full                                                            +---------+---------------+---------+-----------+--------------+--------------+ FV Prox  Full                                                            +---------+---------------+---------+-----------+--------------+--------------+ FV Mid   Full                                                            +---------+---------------+---------+-----------+--------------+--------------+ FV DistalFull                                                            +---------+---------------+---------+-----------+--------------+--------------+  PFV      Full                                                            +---------+---------------+---------+-----------+--------------+--------------+ POP      Full                               pulsatile flow                +---------+---------------+---------+-----------+--------------+--------------+ PTV      Full                                                            +---------+---------------+---------+-----------+--------------+--------------+ PERO     Full                                                            +---------+---------------+---------+-----------+--------------+--------------+   +---------+---------------+---------+-----------+--------------+--------------+ LEFT     CompressibilityPhasicitySpontaneityProperties    Thrombus Aging +---------+---------------+---------+-----------+--------------+--------------+ CFV      Full                               pulsatile flow               +---------+---------------+---------+-----------+--------------+--------------+ SFJ      Full                                                            +---------+---------------+---------+-----------+--------------+--------------+ FV Prox  Full                                                            +---------+---------------+---------+-----------+--------------+--------------+ FV Mid   Full                                                            +---------+---------------+---------+-----------+--------------+--------------+ FV DistalFull                                                            +---------+---------------+---------+-----------+--------------+--------------+ PFV      Full                                                            +---------+---------------+---------+-----------+--------------+--------------+  POP      Full                               pulsatile flow               +---------+---------------+---------+-----------+--------------+--------------+ PTV      Full                                                            +---------+---------------+---------+-----------+--------------+--------------+ PERO     Full                                                             +---------+---------------+---------+-----------+--------------+--------------+     Summary: BILATERAL: - No evidence of deep vein thrombosis seen in the lower extremities, bilaterally. -No evidence of popliteal cyst, bilaterally. RIGHT: pulsatile flow noted  LEFT: Pulsatile flow noted.  *See table(s) above for measurements and observations. Electronically signed by Deitra Mayo MD on 04/19/2022 at 3:40:39 PM.    Final    ECHOCARDIOGRAM COMPLETE  Result Date: 04/19/2022    ECHOCARDIOGRAM REPORT   Patient Name:   ZENAS MANK Date of Exam: 04/19/2022 Medical Rec #:  [id number]         Height:       69.0 in Accession #:    [id number]        Weight:       358.0 lb Date of Birth:  1970-09-29         BSA:          2.646 m Patient Age:    45 years          BP:           123/83 mmHg Patient Gender: M                 HR:           70 bpm. Exam Location:  Inpatient Procedure: 2D Echo, Cardiac Doppler, Color Doppler, Strain Analysis and            Intracardiac Opacification Agent Indications:    CHF-Acute Diastolic [id number]  History:        Patient has prior history of Echocardiogram examinations, most                 recent 07/26/2021. Signs/Symptoms:Chest Pain and Obesity; Risk                 Factors:Hypertension and Dyslipidemia.  Sonographer:    Luane School RDCS Referring Phys: [place]  1. Left ventricular ejection fraction, by estimation, is 55%. The left ventricle has normal function. The left ventricle has no regional wall motion abnormalities. The left ventricular internal cavity size was mildly dilated. There is severe left ventricular hypertrophy. Left ventricular diastolic parameters are consistent with Grade II diastolic dysfunction (pseudonormalization). Elevated left ventricular end-diastolic pressure.  2. Right ventricular systolic function is normal. The right ventricular size is normal. There is normal pulmonary artery  systolic pressure.  3. Left atrial size was moderately dilated.  4. The  mitral valve is abnormal. Trivial mitral valve regurgitation. No evidence of mitral stenosis.  5. The aortic valve was not well visualized. Aortic valve regurgitation is not visualized. No aortic stenosis is present.  6. Aortic dilatation noted. There is mild dilatation of the ascending aorta, measuring 38 mm.  7. The inferior vena cava is normal in size with greater than 50% respiratory variability, suggesting right atrial pressure of 3 mmHg. FINDINGS  Left Ventricle: Left ventricular ejection fraction, by estimation, is 55%. The left ventricle has normal function. The left ventricle has no regional wall motion abnormalities. Definity contrast agent was given IV to delineate the left ventricular endocardial borders. The left ventricular internal cavity size was mildly dilated. There is severe left ventricular hypertrophy. Left ventricular diastolic parameters are consistent with Grade II diastolic dysfunction (pseudonormalization). Elevated left  ventricular end-diastolic pressure. Right Ventricle: The right ventricular size is normal. No increase in right ventricular wall thickness. Right ventricular systolic function is normal. There is normal pulmonary artery systolic pressure. The tricuspid regurgitant velocity is 2.26 m/s, and  with an assumed right atrial pressure of 8 mmHg, the estimated right ventricular systolic pressure is [id number] mmHg. Left Atrium: Left atrial size was moderately dilated. Right Atrium: Right atrial size was normal in size. Pericardium: There is no evidence of pericardial effusion. Mitral Valve: The mitral valve is abnormal. There is mild thickening of the mitral valve leaflet(s). There is mild calcification of the mitral valve leaflet(s). Mild mitral annular calcification. Trivial mitral valve regurgitation. No evidence of mitral valve stenosis. Tricuspid Valve: The tricuspid valve is normal in structure. Tricuspid  valve regurgitation is not demonstrated. No evidence of tricuspid stenosis. Aortic Valve: The aortic valve was not well visualized. Aortic valve regurgitation is not visualized. No aortic stenosis is present. Pulmonic Valve: The pulmonic valve was normal in structure. Pulmonic valve regurgitation is not visualized. No evidence of pulmonic stenosis. Aorta: Aortic dilatation noted. There is mild dilatation of the ascending aorta, measuring 38 mm. Venous: The inferior vena cava is normal in size with greater than 50% respiratory variability, suggesting right atrial pressure of 3 mmHg. IAS/Shunts: The interatrial septum was not well visualized.  LEFT VENTRICLE PLAX 2D LVIDd:         5.50 cm      Diastology LVIDs:         5.00 cm      LV e' medial:    4.54 cm/s LV PW:         1.80 cm      LV E/e' medial:  19.1 LV IVS:        1.90 cm      LV e' lateral:   3.57 cm/s LVOT diam:     2.10 cm      LV E/e' lateral: 24.2 LV SV:         58 LV SV Index:   22 LVOT Area:     3.46 cm  LV Volumes (MOD) LV vol d, MOD A2C: 142.0 ml LV vol d, MOD A4C: 136.0 ml LV vol s, MOD A2C: 72.5 ml LV vol s, MOD A4C: 72.0 ml LV SV MOD A2C:     69.5 ml LV SV MOD A4C:     136.0 ml LV SV MOD BP:      65.7 ml RIGHT VENTRICLE TAPSE (M-mode): 1.8 cm LEFT ATRIUM              Index        RIGHT ATRIUM  Index LA diam:        4.80 cm  1.81 cm/m   RA Area:     18.50 cm LA Vol (A2C):   91.9 ml  34.73 ml/m  RA Volume:   50.50 ml  19.09 ml/m LA Vol (A4C):   116.0 ml 43.84 ml/m LA Biplane Vol: 104.0 ml 39.30 ml/m  AORTIC VALVE LVOT Vmax:   89.35 cm/s LVOT Vmean:  56.050 cm/s LVOT VTI:    0.166 m  AORTA Ao Root diam: 3.70 cm Ao Asc diam:  3.55 cm Ao Desc diam: 2.80 cm MITRAL VALVE               TRICUSPID VALVE MV Area (PHT): 4.49 cm    TR Peak grad:   20.4 mmHg MV Decel Time: 169 msec    TR Vmax:        226.00 cm/s MV E velocity: 86.50 cm/s MV A velocity: 63.40 cm/s  SHUNTS MV E/A ratio:  1.36        Systemic VTI:  0.17 m                             Systemic Diam: 2.10 cm Jenkins Rouge MD Electronically signed by Jenkins Rouge MD Signature Date/Time: 04/19/2022/11:03:52 AM    Final    DG Chest 2 View  Result Date: 04/17/2022 CLINICAL DATA:  sob EXAM: CHEST - 2 VIEW COMPARISON:  Chest x-ray 11/07/2021 FINDINGS: The heart and mediastinal contours are unchanged. No focal consolidation. Slightly increased interstitial markings. No pleural effusion. No pneumothorax. No acute osseous abnormality. IMPRESSION: Findings suggestive of viral bronchiolitis versus reactive airway disease. No definite pulmonary edema. Electronically Signed   By: Iven Finn M.D.   On: 04/17/2022 19:21     TODAY-DAY OF DISCHARGE:  Subjective:   [name] today has no headache,no chest abdominal pain,no new weakness tingling or numbness, feels much better wants to go home today.   Objective:   Blood pressure (!) 160/97, pulse 66, temperature 98 F (36.7 C), resp. rate 18, height 5' 9"$  (1.753 m), weight (!) 161.5 kg, SpO2 97 %.  Intake/Output Summary (Last 24 hours) at 04/20/2022 0952 Last data filed at 04/20/2022 0742 Gross per 24 hour  Intake 840 ml  Output 3850 ml  Net -3010 ml   Filed Weights   04/18/22 1509 04/19/22 0447 04/20/22 0408  Weight: (!) 162.9 kg (!) 162.4 kg (!) 161.5 kg    Exam: Awake Alert, Oriented *3, No new F.N deficits, Normal affect [place].AT,PERRAL Supple Neck,No JVD, No cervical lymphadenopathy appriciated.  Symmetrical Chest wall movement, Good air movement bilaterally, CTAB RRR,No Gallops,Rubs or new Murmurs, No Parasternal Heave +ve B.Sounds, Abd Soft, Non tender, No organomegaly appriciated, No rebound -guarding or rigidity. No Cyanosis, Clubbing or edema, No new Rash or bruise   PERTINENT RADIOLOGIC STUDIES: VAS [place] LOWER EXTREMITY VENOUS (DVT)  Result Date: 04/19/2022  Lower Venous DVT Study Patient Name:  TRAMPUS BAREFIELD  Date of Exam:   04/19/2022 Medical Rec #: [id number]          Accession #:    [id number] Date of  Birth: 11/22/1970          Patient Gender: M Patient Age:   43 years Exam [facility]:  [facility] Procedure:      VAS [place] LOWER EXTREMITY VENOUS (DVT) Referring Phys: Oren Binet --------------------------------------------------------------------------------  Indications: Edema.  Comparison Study: No prior study on file Performing Technologist: Candace  Mauro Kaufmann RVS  Examination Guidelines: A complete evaluation includes B-mode imaging, spectral Doppler, color Doppler, and power Doppler as needed of all accessible portions of each vessel. Bilateral testing is considered an integral part of a complete examination. Limited examinations for reoccurring indications may be performed as noted. The reflux portion of the exam is performed with the patient in reverse Trendelenburg.  +---------+---------------+---------+-----------+--------------+--------------+ RIGHT    CompressibilityPhasicitySpontaneityProperties    Thrombus Aging +---------+---------------+---------+-----------+--------------+--------------+ CFV      Full                               pulsatile flow               +---------+---------------+---------+-----------+--------------+--------------+ SFJ      Full                                                            +---------+---------------+---------+-----------+--------------+--------------+ FV Prox  Full                                                            +---------+---------------+---------+-----------+--------------+--------------+ FV Mid   Full                                                            +---------+---------------+---------+-----------+--------------+--------------+ FV DistalFull                                                            +---------+---------------+---------+-----------+--------------+--------------+ PFV      Full                                                             +---------+---------------+---------+-----------+--------------+--------------+ POP      Full                               pulsatile flow               +---------+---------------+---------+-----------+--------------+--------------+ PTV      Full                                                            +---------+---------------+---------+-----------+--------------+--------------+ PERO     Full                                                            +---------+---------------+---------+-----------+--------------+--------------+   +---------+---------------+---------+-----------+--------------+--------------+  LEFT     CompressibilityPhasicitySpontaneityProperties    Thrombus Aging +---------+---------------+---------+-----------+--------------+--------------+ CFV      Full                               pulsatile flow               +---------+---------------+---------+-----------+--------------+--------------+ SFJ      Full                                                            +---------+---------------+---------+-----------+--------------+--------------+ FV Prox  Full                                                            +---------+---------------+---------+-----------+--------------+--------------+ FV Mid   Full                                                            +---------+---------------+---------+-----------+--------------+--------------+ FV DistalFull                                                            +---------+---------------+---------+-----------+--------------+--------------+ PFV      Full                                                            +---------+---------------+---------+-----------+--------------+--------------+ POP      Full                               pulsatile flow               +---------+---------------+---------+-----------+--------------+--------------+ PTV      Full                                                             +---------+---------------+---------+-----------+--------------+--------------+ PERO     Full                                                            +---------+---------------+---------+-----------+--------------+--------------+     Summary: BILATERAL: - No evidence of deep vein thrombosis seen in the lower extremities, bilaterally. -No evidence  of popliteal cyst, bilaterally. RIGHT: pulsatile flow noted  LEFT: Pulsatile flow noted.  *See table(s) above for measurements and observations. Electronically signed by Deitra Mayo MD on 04/19/2022 at 3:40:39 PM.    Final    ECHOCARDIOGRAM COMPLETE  Result Date: 04/19/2022    ECHOCARDIOGRAM REPORT   Patient Name:   JAVANI PFAUTZ Date of Exam: 04/19/2022 Medical Rec #:  [id number]         Height:       69.0 in Accession #:    [id number]        Weight:       358.0 lb Date of Birth:  05-16-1970         BSA:          2.646 m Patient Age:    77 years          BP:           123/83 mmHg Patient Gender: M                 HR:           70 bpm. Exam Location:  Inpatient Procedure: 2D Echo, Cardiac Doppler, Color Doppler, Strain Analysis and            Intracardiac Opacification Agent Indications:    CHF-Acute Diastolic [id number]  History:        Patient has prior history of Echocardiogram examinations, most                 recent 07/26/2021. Signs/Symptoms:Chest Pain and Obesity; Risk                 Factors:Hypertension and Dyslipidemia.  Sonographer:    Luane School RDCS Referring Phys: [place]  1. Left ventricular ejection fraction, by estimation, is 55%. The left ventricle has normal function. The left ventricle has no regional wall motion abnormalities. The left ventricular internal cavity size was mildly dilated. There is severe left ventricular hypertrophy. Left ventricular diastolic parameters are consistent with Grade II diastolic dysfunction (pseudonormalization). Elevated left  ventricular end-diastolic pressure.  2. Right ventricular systolic function is normal. The right ventricular size is normal. There is normal pulmonary artery systolic pressure.  3. Left atrial size was moderately dilated.  4. The mitral valve is abnormal. Trivial mitral valve regurgitation. No evidence of mitral stenosis.  5. The aortic valve was not well visualized. Aortic valve regurgitation is not visualized. No aortic stenosis is present.  6. Aortic dilatation noted. There is mild dilatation of the ascending aorta, measuring 38 mm.  7. The inferior vena cava is normal in size with greater than 50% respiratory variability, suggesting right atrial pressure of 3 mmHg. FINDINGS  Left Ventricle: Left ventricular ejection fraction, by estimation, is 55%. The left ventricle has normal function. The left ventricle has no regional wall motion abnormalities. Definity contrast agent was given IV to delineate the left ventricular endocardial borders. The left ventricular internal cavity size was mildly dilated. There is severe left ventricular hypertrophy. Left ventricular diastolic parameters are consistent with Grade II diastolic dysfunction (pseudonormalization). Elevated left  ventricular end-diastolic pressure. Right Ventricle: The right ventricular size is normal. No increase in right ventricular wall thickness. Right ventricular systolic function is normal. There is normal pulmonary artery systolic pressure. The tricuspid regurgitant velocity is 2.26 m/s, and  with an assumed right atrial pressure of 8 mmHg, the estimated right ventricular systolic pressure is [id number] mmHg. Left Atrium: Left atrial size was moderately dilated. Right  Atrium: Right atrial size was normal in size. Pericardium: There is no evidence of pericardial effusion. Mitral Valve: The mitral valve is abnormal. There is mild thickening of the mitral valve leaflet(s). There is mild calcification of the mitral valve leaflet(s). Mild mitral annular  calcification. Trivial mitral valve regurgitation. No evidence of mitral valve stenosis. Tricuspid Valve: The tricuspid valve is normal in structure. Tricuspid valve regurgitation is not demonstrated. No evidence of tricuspid stenosis. Aortic Valve: The aortic valve was not well visualized. Aortic valve regurgitation is not visualized. No aortic stenosis is present. Pulmonic Valve: The pulmonic valve was normal in structure. Pulmonic valve regurgitation is not visualized. No evidence of pulmonic stenosis. Aorta: Aortic dilatation noted. There is mild dilatation of the ascending aorta, measuring 38 mm. Venous: The inferior vena cava is normal in size with greater than 50% respiratory variability, suggesting right atrial pressure of 3 mmHg. IAS/Shunts: The interatrial septum was not well visualized.  LEFT VENTRICLE PLAX 2D LVIDd:         5.50 cm      Diastology LVIDs:         5.00 cm      LV e' medial:    4.54 cm/s LV PW:         1.80 cm      LV E/e' medial:  19.1 LV IVS:        1.90 cm      LV e' lateral:   3.57 cm/s LVOT diam:     2.10 cm      LV E/e' lateral: 24.2 LV SV:         58 LV SV Index:   22 LVOT Area:     3.46 cm  LV Volumes (MOD) LV vol d, MOD A2C: 142.0 ml LV vol d, MOD A4C: 136.0 ml LV vol s, MOD A2C: 72.5 ml LV vol s, MOD A4C: 72.0 ml LV SV MOD A2C:     69.5 ml LV SV MOD A4C:     136.0 ml LV SV MOD BP:      65.7 ml RIGHT VENTRICLE TAPSE (M-mode): 1.8 cm LEFT ATRIUM              Index        RIGHT ATRIUM           Index LA diam:        4.80 cm  1.81 cm/m   RA Area:     18.50 cm LA Vol (A2C):   91.9 ml  34.73 ml/m  RA Volume:   50.50 ml  19.09 ml/m LA Vol (A4C):   116.0 ml 43.84 ml/m LA Biplane Vol: 104.0 ml 39.30 ml/m  AORTIC VALVE LVOT Vmax:   89.35 cm/s LVOT Vmean:  56.050 cm/s LVOT VTI:    0.166 m  AORTA Ao Root diam: 3.70 cm Ao Asc diam:  3.55 cm Ao Desc diam: 2.80 cm MITRAL VALVE               TRICUSPID VALVE MV Area (PHT): 4.49 cm    TR Peak grad:   20.4 mmHg MV Decel Time: 169 msec    TR  Vmax:        226.00 cm/s MV E velocity: 86.50 cm/s MV A velocity: 63.40 cm/s  SHUNTS MV E/A ratio:  1.36        Systemic VTI:  0.17 m  Systemic Diam: 2.10 cm Jenkins Rouge MD Electronically signed by Jenkins Rouge MD Signature Date/Time: 04/19/2022/11:03:52 AM    Final      PERTINENT LAB RESULTS: CBC: Recent Labs    04/17/22 1905  WBC 11.6*  HGB 15.5  HCT 44.6  PLT 260   CMET CMP     Component Value Date/Time   NA 139 04/20/2022 0041   K 3.9 04/20/2022 0041   CL 101 04/20/2022 0041   CO2 28 04/20/2022 0041   GLUCOSE 132 (H) 04/20/2022 0041   BUN 23 (H) 04/20/2022 0041   CREATININE 1.31 (H) 04/20/2022 0041   CALCIUM 8.6 (L) 04/20/2022 0041   PROT 7.4 11/07/2021 1354   ALBUMIN 4.1 11/07/2021 1354   AST 31 11/07/2021 1354   ALT 37 11/07/2021 1354   ALKPHOS 74 11/07/2021 1354   BILITOT 0.6 11/07/2021 1354   GFRNONAA >60 04/20/2022 0041   GFRAA >60 10/14/2018 1602    GFR Estimated Creatinine Clearance: 103.2 mL/min (A) (by C-G formula based on SCr of 1.31 mg/dL (H)). No results for input(s): "LIPASE", "AMYLASE" in the last 72 hours. No results for input(s): "CKTOTAL", "CKMB", "CKMBINDEX", "TROPONINI" in the last 72 hours. Invalid input(s): "POCBNP" No results for input(s): "DDIMER" in the last 72 hours. Recent Labs    04/20/22 0041  HGBA1C 6.8*   No results for input(s): "CHOL", "HDL", "LDLCALC", "TRIG", "CHOLHDL", "LDLDIRECT" in the last 72 hours. Recent Labs    04/18/22 1501  TSH 3.925   No results for input(s): "VITAMINB12", "FOLATE", "FERRITIN", "TIBC", "IRON", "RETICCTPCT" in the last 72 hours. Coags: No results for input(s): "INR" in the last 72 hours.  Invalid input(s): "PT" Microbiology: No results found for this or any previous visit (from the past 240 hour(s)).  FURTHER DISCHARGE INSTRUCTIONS:  Get Medicines reviewed and adjusted: Please take all your medications with you for your next visit with your Primary  MD  Laboratory/radiological data: Please request your Primary MD to go over all hospital tests and procedure/radiological results at the follow up, please ask your Primary MD to get all Hospital records sent to his/her office.  In some cases, they will be blood work, cultures and biopsy results pending at the time of your discharge. Please request that your primary care M.D. goes through all the records of your hospital data and follows up on these results.  Also Note the following: If you experience worsening of your admission symptoms, develop shortness of breath, life threatening emergency, suicidal or homicidal thoughts you must seek medical attention immediately by calling 911 or calling your MD immediately  if symptoms less severe.  You must read complete instructions/literature along with all the possible adverse reactions/side effects for all the Medicines you take and that have been prescribed to you. Take any new Medicines after you have completely understood and accpet all the possible adverse reactions/side effects.   Do not drive when taking Pain medications or sleeping medications (Benzodaizepines)  Do not take more than prescribed Pain, Sleep and Anxiety Medications. It is not advisable to combine anxiety,sleep and pain medications without talking with your primary care practitioner  Special Instructions: If you have smoked or chewed Tobacco  in the last 2 yrs please stop smoking, stop any regular Alcohol  and or any Recreational drug use.  Wear Seat belts while driving.  Please note: You were cared for by a hospitalist during your hospital stay. Once you are discharged, your primary care physician will handle any further medical issues. Please note that  NO REFILLS for any discharge medications will be authorized once you are discharged, as it is imperative that you return to your primary care physician (or establish a relationship with a primary care physician if you do not have  one) for your post hospital discharge needs so that they can reassess your need for medications and monitor your lab values.  Total Time spent coordinating discharge including counseling, education and face to face time equals greater than 30 minutes.  SignedOren Binet 04/20/2022 9:52 AM

## 2022-04-20 NOTE — Telephone Encounter (Signed)
Patient Advocate Encounter  Prior Authorization for Ozempic (0.25 or 0.5 MG/DOSE) 2MG/3ML pen-injectors has been approved.    PA# [id number] Effective dates: 04/20/2022 through 04/21/2023      Lyndel Safe, [place] Patient Advocate Specialist [facility] Patient Advocate Team Direct Number: [phone]  Fax: [phone]

## 2022-04-20 NOTE — Progress Notes (Signed)
Pt has orders to be discharged. Discharge instructions given and pt has no additional questions at this time. Medication regimen reviewed and pt educated. Pt verbalized understanding and has no additional questions. Telemetry box removed. IV removed and site in good condition. Patient is awaiting medications from [facility].

## 2022-04-20 NOTE — TOC Transition Note (Signed)
Transition of Care [facility]) - CM/SW Discharge Note   Patient Details  Name: Benjamin Small MRN: [id number] Date of Birth: 04/12/1970  [facility] CM/SW Contact:  Zenon Mayo, RN Phone Number: 04/20/2022, 10:15 AM   Clinical Narrative:    Patient is for dc today, TOC to fill his meds.  Wife at bedside to transport him home.         Patient Goals and CMS Choice      Discharge Placement                         Discharge Plan and Services Additional resources added to the After Visit Summary for                                       Social Determinants of Health (SDOH) Interventions SDOH Screenings   Food Insecurity: No Food Insecurity (04/18/2022)  Housing: Low Risk  (04/18/2022)  Transportation Needs: No Transportation Needs (04/18/2022)  Utilities: Not At Risk (04/18/2022)  Tobacco Use: High Risk (04/17/2022)     Readmission Risk Interventions     No data to display

## 2022-04-27 NOTE — Progress Notes (Unsigned)
Cardiology Office Note:   Date:  04/27/2022  NAME:  Benjamin Small    MRN: [id number] DOB:  Jan 10, 1971   PCP:  Pcp, No  Cardiologist:  None  Electrophysiologist:  None   Referring MD: No ref. provider found   No chief complaint on file. ***  History of Present Illness:   Benjamin Small is a 52 y.o. male with a hx of hypertension, diabetes, HFpEF, obesity who is being seen today for the evaluation of HFpEF.  Problem List HFpEF -normal LHC 07/29/2021 HLD -T chol 150, HDL 32, LDL 98, TG 101 DM -A1c 6.8 HTN Obesity OSA  Past Medical History: Past Medical History:  Diagnosis Date   ADD (attention deficit disorder)    Adult ADHD    Anxiety disorder    Carpal tunnel syndrome on left    COPD (chronic obstructive pulmonary disease) ([place])    Essential hypertension 12/25/2014   Hashimoto's disease    Hyperlipidemia    Hypertension    Long term use of drug    Male erectile disorder    Migraines    Obesity    OSA (obstructive sleep apnea) 12/25/2014   Sleep apnea    Testicular hypofunction    Tobacco abuse 12/25/2014   Vitamin D deficiency     Past Surgical History: Past Surgical History:  Procedure Laterality Date   HEMORRHOID SURGERY     LEFT HEART CATH AND CORONARY ANGIOGRAPHY N/A 07/29/2021   Procedure: LEFT HEART CATH AND CORONARY ANGIOGRAPHY;  Surgeon: Belva Crome, MD;  Location: [facility];  Service: Cardiovascular;  Laterality: N/A;    Current Medications: No outpatient medications have been marked as taking for the 04/28/22 encounter (Appointment) with O'Neal, Cassie Freer, MD.     Allergies:    Patient has no known allergies.   Social History: Social History   Socioeconomic History   Marital status: Married    Spouse name: Not on file   Number of children: Not on file   Years of education: Not on file   Highest education level: Not on file  Occupational History   Not on file  Tobacco Use   Smoking status: Every Day     Packs/day: 1.00    Types: Cigarettes   Smokeless tobacco: Never  Substance and Sexual Activity   Alcohol use: Yes    Comment: occ   Drug use: No   Sexual activity: Not on file  Other Topics Concern   Not on file  Social History Narrative   Not on file   Social Determinants of Health   Financial Resource Strain: Not on file  Food Insecurity: No Food Insecurity (04/18/2022)   Hunger Vital Sign    Worried About Running Out of Food in the Last Year: Never true    Ran Out of Food in the Last Year: Never true  Transportation Needs: No Transportation Needs (04/18/2022)   PRAPARE - Hydrologist (Medical): No    Lack of Transportation (Non-Medical): No  Physical Activity: Not on file  Stress: Not on file  Social Connections: Not on file     Family History: The patient's ***family history includes Alcohol abuse in his mother; Breast cancer in his paternal grandmother; Cancer in his father, maternal grandfather, and maternal grandmother.  ROS:   All other ROS reviewed and negative. Pertinent positives noted in the HPI.     EKGs/Labs/Other Studies Reviewed:   The following studies were personally reviewed by me today:  EKG:  EKG is *** ordered today.  The ekg ordered today demonstrates ***, and was personally reviewed by me.   TTE 04/19/2022  1. Left ventricular ejection fraction, by estimation, is 55%. The left  ventricle has normal function. The left ventricle has no regional wall  motion abnormalities. The left ventricular internal cavity size was mildly  dilated. There is severe left  ventricular hypertrophy. Left ventricular diastolic parameters are  consistent with Grade II diastolic dysfunction (pseudonormalization).  Elevated left ventricular end-diastolic pressure.   2. Right ventricular systolic function is normal. The right ventricular  size is normal. There is normal pulmonary artery systolic pressure.   3. Left atrial size was moderately  dilated.   4. The mitral valve is abnormal. Trivial mitral valve regurgitation. No  evidence of mitral stenosis.   5. The aortic valve was not well visualized. Aortic valve regurgitation  is not visualized. No aortic stenosis is present.   6. Aortic dilatation noted. There is mild dilatation of the ascending  aorta, measuring 38 mm.   7. The inferior vena cava is normal in size with greater than 50%  respiratory variability, suggesting right atrial pressure of 3 mmHg.   Recent Labs: 07/26/2021: Magnesium 2.0 11/07/2021: ALT 37 04/17/2022: B Natriuretic Peptide 322.2; Hemoglobin 15.5; Platelets 260 04/18/2022: TSH 3.925 04/20/2022: BUN 23; Creatinine, Ser 1.31; Potassium 3.9; Sodium 139   Recent Lipid Panel    Component Value Date/Time   CHOL 150 07/26/2021 0342   TRIG 101 07/26/2021 0342   HDL 32 (L) 07/26/2021 0342   CHOLHDL 4.7 07/26/2021 0342   VLDL 20 07/26/2021 0342   LDLCALC 98 07/26/2021 0342    Physical Exam:   VS:  There were no vitals taken for this visit.   Wt Readings from Last 3 Encounters:  04/20/22 (!) 356 lb 0.7 oz (161.5 kg)  07/28/21 (!) 349 lb 13.9 oz (158.7 kg)  01/30/19 (!) 325 lb (147.4 kg)    General: Well nourished, well developed, in no acute distress Head: Atraumatic, normal size  Eyes: PEERLA, EOMI  Neck: Supple, no JVD Endocrine: No thryomegaly Cardiac: Normal S1, S2; RRR; no murmurs, rubs, or gallops Lungs: Clear to auscultation bilaterally, no wheezing, rhonchi or rales  Abd: Soft, nontender, no hepatomegaly  Ext: No edema, pulses 2+ Musculoskeletal: No deformities, BUE and BLE strength normal and equal Skin: Warm and dry, no rashes   Neuro: Alert and oriented to person, place, time, and situation, CNII-XII grossly intact, no focal deficits  Psych: Normal mood and affect   ASSESSMENT:   Benjamin Small is a 52 y.o. male who presents for the following: No diagnosis found.  PLAN:   There are no diagnoses linked to this encounter.  {Are  you ordering a CV Procedure (e.g. stress test, cath, DCCV, TEE, etc)?   Press F2        :[id number]  Disposition: No follow-ups on file.  Medication Adjustments/Labs and Tests Ordered: Current medicines are reviewed at length with the patient today.  Concerns regarding medicines are outlined above.  No orders of the defined types were placed in this encounter.  No orders of the defined types were placed in this encounter.   There are no Patient Instructions on file for this visit.   Time Spent with Patient: I have spent a total of *** minutes with patient reviewing hospital notes, telemetry, EKGs, labs and examining the patient as well as establishing an assessment and plan that was discussed with the patient.  > 50%  of time was spent in direct patient care.  Signed, Addison Naegeli. Audie Box, MD, [facility]  [address] [place], [place] [address] [phone]  04/27/2022 9:46 AM

## 2022-04-28 ENCOUNTER — Ambulatory Visit: Payer: BC Managed Care – PPO | Attending: Cardiovascular Disease | Admitting: Cardiovascular Disease

## 2022-04-28 DIAGNOSIS — I150 Renovascular hypertension: Secondary | ICD-10-CM

## 2022-04-28 DIAGNOSIS — I5032 Chronic diastolic (congestive) heart failure: Secondary | ICD-10-CM

## 2022-04-28 DIAGNOSIS — E782 Mixed hyperlipidemia: Secondary | ICD-10-CM

## 2022-06-07 ENCOUNTER — Other Ambulatory Visit (HOSPITAL_BASED_OUTPATIENT_CLINIC_OR_DEPARTMENT_OTHER): Payer: Self-pay
